# Patient Record
Sex: Female | Born: 1950 | Race: Black or African American | Hispanic: No | Smoking: Never smoker
Health system: Southern US, Community
[De-identification: ages and names within clinical notes are randomized; demographics above are authoritative.]

## PROBLEM LIST (undated history)

## (undated) DIAGNOSIS — F32A Depression, unspecified: Secondary | ICD-10-CM

## (undated) DIAGNOSIS — F329 Major depressive disorder, single episode, unspecified: Secondary | ICD-10-CM

## (undated) DIAGNOSIS — I10 Essential (primary) hypertension: Secondary | ICD-10-CM

## (undated) DIAGNOSIS — M1990 Unspecified osteoarthritis, unspecified site: Secondary | ICD-10-CM

## (undated) HISTORY — PX: ABDOMINAL HYSTERECTOMY: SHX81

---

## 2014-07-16 ENCOUNTER — Emergency Department (HOSPITAL_COMMUNITY)
Admission: EM | Admit: 2014-07-16 | Discharge: 2014-07-18 | Disposition: A | Discharge status: Home / Self Care | Payer: Federal, State, Local not specified - Other | Attending: Emergency Medicine | Admitting: Emergency Medicine

## 2014-07-16 ENCOUNTER — Other Ambulatory Visit: Payer: Self-pay

## 2014-07-16 DIAGNOSIS — F22 Delusional disorders: Secondary | ICD-10-CM | POA: Insufficient documentation

## 2014-07-16 DIAGNOSIS — F29 Unspecified psychosis not due to a substance or known physiological condition: Secondary | ICD-10-CM | POA: Insufficient documentation

## 2014-07-16 DIAGNOSIS — F1410 Cocaine abuse, uncomplicated: Secondary | ICD-10-CM | POA: Diagnosis present

## 2014-07-16 DIAGNOSIS — F1494 Cocaine use, unspecified with cocaine-induced mood disorder: Secondary | ICD-10-CM | POA: Diagnosis present

## 2014-07-16 DIAGNOSIS — R000 Tachycardia, unspecified: Secondary | ICD-10-CM | POA: Insufficient documentation

## 2014-07-16 DIAGNOSIS — F4325 Adjustment disorder with mixed disturbance of emotions and conduct: Secondary | ICD-10-CM | POA: Diagnosis present

## 2014-07-16 DIAGNOSIS — F911 Conduct disorder, childhood-onset type: Secondary | ICD-10-CM | POA: Insufficient documentation

## 2014-07-16 DIAGNOSIS — R52 Pain, unspecified: Secondary | ICD-10-CM

## 2014-07-16 DIAGNOSIS — F1310 Sedative, hypnotic or anxiolytic abuse, uncomplicated: Secondary | ICD-10-CM | POA: Insufficient documentation

## 2014-07-16 LAB — ACETAMINOPHEN LEVEL: Acetaminophen (Tylenol), Serum: <15 ug/mL (ref 10–30)

## 2014-07-16 LAB — COMPREHENSIVE METABOLIC PANEL
ALT: 17 U/L (ref 0–35)
ANION GAP: 17 — AB (ref 5–15)
AST: 33 U/L (ref 0–37)
Albumin: 4.2 g/dL (ref 3.5–5.2)
Alkaline Phosphatase: 68 U/L (ref 39–117)
BILIRUBIN TOTAL: 0.9 mg/dL (ref 0.3–1.2)
BUN: 10 mg/dL (ref 6–23)
CALCIUM: 9.9 mg/dL (ref 8.4–10.5)
CHLORIDE: 105 meq/L (ref 96–112)
CO2: 23 meq/L (ref 19–32)
Creatinine, Ser: 0.99 mg/dL (ref 0.50–1.10)
GFR calc Af Amer: 69 mL/min — ABNORMAL LOW (ref >90)
GFR calc non Af Amer: 60 mL/min — ABNORMAL LOW (ref >90)
Glucose, Bld: 110 mg/dL — ABNORMAL HIGH (ref 70–99)
Potassium: 4.3 mEq/L (ref 3.7–5.3)
Sodium: 145 mEq/L (ref 137–147)
Total Protein: 7.6 g/dL (ref 6.0–8.3)

## 2014-07-16 LAB — CBC WITH DIFFERENTIAL/PLATELET
BASOS ABS: 0.1 10*3/uL (ref 0.0–0.1)
BASOS PCT: 1 % (ref 0–1)
EOS ABS: 0.1 10*3/uL (ref 0.0–0.7)
Eosinophils Relative: 2 % (ref 0–5)
HCT: 34.2 % — ABNORMAL LOW (ref 36.0–46.0)
Hemoglobin: 11.1 g/dL — ABNORMAL LOW (ref 12.0–15.0)
Lymphocytes Relative: 43 % (ref 12–46)
Lymphs Abs: 2.8 10*3/uL (ref 0.7–4.0)
MCH: 28.5 pg (ref 26.0–34.0)
MCHC: 32.5 g/dL (ref 30.0–36.0)
MCV: 87.9 fL (ref 78.0–100.0)
Monocytes Absolute: 0.6 10*3/uL (ref 0.1–1.0)
Monocytes Relative: 10 % (ref 3–12)
NEUTROS PCT: 44 % (ref 43–77)
Neutro Abs: 2.9 10*3/uL (ref 1.7–7.7)
Platelets: 231 10*3/uL (ref 150–400)
RBC: 3.89 MIL/uL (ref 3.87–5.11)
RDW: 14.6 % (ref 11.5–15.5)
WBC: 6.5 10*3/uL (ref 4.0–10.5)

## 2014-07-16 LAB — SALICYLATE LEVEL

## 2014-07-16 LAB — RAPID URINE DRUG SCREEN, HOSP PERFORMED
Amphetamines: NOT DETECTED
Barbiturates: NOT DETECTED
Benzodiazepines: POSITIVE — AB
COCAINE: POSITIVE — AB
OPIATES: NOT DETECTED
Tetrahydrocannabinol: NOT DETECTED

## 2014-07-16 LAB — ETHANOL

## 2014-07-16 MED ORDER — LORAZEPAM 2 MG/ML IJ SOLN
1.0000 mg | Freq: Once | INTRAMUSCULAR | Status: AC
  Administered 2014-07-16: 1 mg via INTRAVENOUS
  Filled 2014-07-16: qty 1

## 2014-07-16 MED ORDER — CARBAMAZEPINE 200 MG PO TABS
200.0000 mg | ORAL_TABLET | Freq: Two times a day (BID) | ORAL | Status: DC
  Administered 2014-07-17 – 2014-07-18 (×4): 200 mg via ORAL
  Filled 2014-07-16 (×6): qty 1

## 2014-07-16 MED ORDER — TRAZODONE HCL 50 MG PO TABS: 50.0000 mg | ORAL_TABLET | Freq: Every evening | ORAL | Status: DC | PRN

## 2014-07-16 MED ORDER — HALOPERIDOL LACTATE 5 MG/ML IJ SOLN
5.0000 mg | Freq: Once | INTRAMUSCULAR | Status: AC
  Administered 2014-07-16: 5 mg via INTRAMUSCULAR
  Filled 2014-07-16: qty 1

## 2014-07-16 NOTE — ED Notes (Signed)
Report received from Brooks RN. Pt. Sleeping, respirations regular and unlabored. Will continue to monitor for safety. Q 15 minute checks continue. 

## 2014-07-16 NOTE — BH Assessment (Signed)
Assessment Note  Ashley Branch is an 63 y.o. female. [facility] initiated by daughter b/o bizarre behaviors, combative, and attempting to choking self.  Patient is currently resistant to questions during this assessment.  Patient is assumed to fall asleep between questions but is slightly aroused by touch.   CSW spoke with the patient's daughter Ashley Branch [phone] or [phone] to collect collateral information.  She reports that the patient's husband has cancer and was in the hospital for a couple so the patient was at the beside the entire time.  In the hospital the patient was exhibiting aggression towards the staff and being irrational.  Daughter reports this is not normal behavior as she is more rational and conservative.  When they returned home the patient called 911 reporting that she could see the husband's blood clot moving up his leg requesting help.  The husband was not experiencing any medical complications at that time per the daughter. When the police arrived the patient became aggressive and push one of the officer.  The patient was arrested but returned home later that day.  Daughter reports the patient attempted to choke self with hair until she began to gasp for air.  Patient was running out of the home naked per daughter's report.   CSW consulted with Dr. Jannifer FranklinAkintayo it is recommended for inpatient hospitalization for safety and stabilization.    Axis I: Adjustment D/O mixed, Cocaine use moderate Axis II: Deferred Axis III: No past medical history on file. Axis IV: other psychosocial or environmental problems, problems with access to health care services and problems with primary support group Axis V: 41-50 serious symptoms  Past Medical History: No past medical history on file.  No past surgical history on file.  Family History: No family history on file.  Social History:  has no tobacco, alcohol, and drug history on  file.  Additional Social History:     CIWA: CIWA-Ar BP: 128/69 mmHg Pulse Rate: 97 COWS:    Allergies: Allergies not on file  Home Medications:  (Not in a hospital admission)  OB/GYN Status:  No LMP recorded.  General Assessment Data Location of Assessment: [facility] ACT Assessment: Yes Is this a Tele or Face-to-Face Assessment?: Face-to-Face Is this an Initial Assessment or a Re-assessment for this encounter?: Initial Assessment Living Arrangements: Spouse/significant other Can pt return to current living arrangement?: Yes Admission Status: Involuntary Is patient capable of signing voluntary admission?: No Transfer from: Home Referral Source: Self/Family/Friend  Medical Screening Exam [facility] Walk-in ONLY) Medical Exam completed: Yes  [facility] Living Arrangements: Spouse/significant other Name of Psychiatrist: unknown Name of Therapist: unknown     Risk to self with the past 6 months Suicidal Ideation: Yes-Currently Present Suicidal Intent: Yes-Currently Present Is patient at risk for suicide?: Yes Suicidal Plan?: Yes-Currently Present (strangle self) Specify Current Suicidal Plan: pt attempted strangle self with hair Access to Means: Yes What has been your use of drugs/alcohol within the last 12 months?: crack Intentional Self Injurious Behavior: None Family Suicide History: Unknown Recent stressful life event(s): Loss (Comment) Persecutory voices/beliefs?: No Depression:  (unable to assess) Substance abuse history and/or treatment for substance abuse?: Yes  Risk to Others within the past 6 months Homicidal Ideation: No-Not Currently/Within Last 6 Months Thoughts of Harm to Others: No-Not Currently Present/Within Last 6 Months Current Homicidal Intent: No-Not Currently/Within Last 6 Months Current Homicidal Plan: No-Not Currently/Within Last 6 Months Access to Homicidal Means: No History  of harm to others?: No Assessment of Violence: None  Noted Violent Behavior Description: belligerent, combative Does patient have access to weapons?: No  Psychosis Delusions: Persecutory  Mental Status Report Appear/Hygiene: Bizarre, In hospital gown Eye Contact: Poor Motor Activity: Unable to assess Speech: Unable to assess Level of Consciousness: Sleeping, Drowsy Mood: Irritable Affect: Irritable, Unable to Assess Anxiety Level: None Thought Processes: Unable to Assess Judgement: Impaired Orientation: Unable to assess Obsessive Compulsive Thoughts/Behaviors: None  Cognitive Functioning Concentration: Unable to Assess Memory: Unable to Assess Insight: Unable to Assess Impulse Control: Unable to Assess Sleep: Unable to Assess Vegetative Symptoms: Unable to Assess  ADLScreening [facility] Assessment Services) Patient's cognitive ability adequate to safely complete daily activities?: Yes Patient able to express need for assistance with ADLs?: Yes Independently performs ADLs?: Yes (appropriate for developmental age)        ADL Screening (condition at time of admission) Patient's cognitive ability adequate to safely complete daily activities?: Yes Patient able to express need for assistance with ADLs?: Yes Independently performs ADLs?: Yes (appropriate for developmental age)                  Additional Information 1:1 In Past 12 Months?: No CIRT Risk: No Elopement Risk: No Does patient have medical clearance?: Yes     Disposition:  Disposition Initial Assessment Completed for this Encounter: Yes Disposition of Patient: Inpatient treatment program Type of inpatient treatment program: Adult  On Site Evaluation by:   Reviewed with Physician:    Maryelizabeth Rowanorbett, Kahlia Lagunes A 07/16/2014 12:45 PM

## 2014-07-16 NOTE — ED Notes (Addendum)
Pt's Belongings: 1 white shirt  1 pink sweat pants 1 pair black slippers 1 white and pink stripped sweatshirt. 1 pair white panties. 2 silver colored rings.

## 2014-07-16 NOTE — ED Notes (Signed)
Attempted x 2 to contact pt's daughter Toney ReilDaisy @ [phone] w/o success to obtain past medical hx.

## 2014-07-16 NOTE — ED Notes (Signed)
Pharmacy Tech here to see pt.

## 2014-07-16 NOTE — ED Provider Notes (Signed)
CSN: [id number]     Arrival date & time 07/16/14  0404 History   First MD Initiated Contact with Patient 07/16/14 0407     Chief Complaint  Patient presents with  . Psychiatric Evaluation    IVC     (Consider location/radiation/quality/duration/timing/severity/associated sxs/prior Treatment) Patient is a 63 y.o. female presenting with mental health disorder. The history is provided by the police. No language interpreter was used.  Mental Health Problem Presenting symptoms: agitation, bizarre behavior and delusional   Associated symptoms comment:  She arrives under IVC, taken out by family, for unusual behavior at home: screaming, stating she wanted to kill herself; 'choking' herself with her braided hair. Per IVC paperwork (family unavailable) she had to be physically restrained at home to keep her safe. After arrival here she was agitated, verbally and physically aggressive with [employer] and staff requiring soft restraints and chemical restraint. Mask placed on patient to prevent her from spitting on staff.   No past medical history on file. No past surgical history on file. No family history on file. History  Substance Use Topics  . Smoking status: Not on file  . Smokeless tobacco: Not on file  . Alcohol Use: Not on file   OB History    No data available     Review of Systems  Unable to perform ROS Psychiatric/Behavioral: Positive for agitation.      Allergies  Review of patient's allergies indicates not on file.  Home Medications   Prior to Admission medications   Not on File   BP 172/65 mmHg  Pulse 128  Temp(Src) 98.5 F (36.9 C) (Axillary)  Resp 24  SpO2 98% Physical Exam  Constitutional: She appears well-developed and well-nourished. No distress.  Neck: Normal range of motion. Neck supple.  Cardiovascular: Tachycardia present.   Pulmonary/Chest: No respiratory distress. She has no wheezes. She has no rales.  Abdominal: Soft.  Psychiatric: Her affect is  angry. She is agitated, aggressive and combative. Thought content is delusional. She expresses impulsivity and inappropriate judgment.    ED Course  Procedures (including critical care time) Labs Review Labs Reviewed  COMPREHENSIVE METABOLIC PANEL - Abnormal; Notable for the following:    Glucose, Bld 110 (*)    GFR calc non Af Amer 60 (*)    GFR calc Af Amer 69 (*)    Anion gap 17 (*)    All other components within normal limits  SALICYLATE LEVEL - Abnormal; Notable for the following:    Salicylate Lvl <2.0 (*)    All other components within normal limits  CBC WITH DIFFERENTIAL - Abnormal; Notable for the following:    Hemoglobin 11.1 (*)    HCT 34.2 (*)    All other components within normal limits  ETHANOL  ACETAMINOPHEN LEVEL  URINE RAPID DRUG SCREEN (HOSP PERFORMED)    Imaging Review No results found.   EKG Interpretation None      MDM   Final diagnoses:  None    1. Psychosis  Haldol and Ativan given. She is sleeping, VSS.  Past history unknown and patient's family is not available to provide further details. Will observe and wait for lab clearance. Placement anticipated pending reassessment.   Arnoldo HookerShari A Kami Kube, PA-C 07/16/14 [id number]  Purvis SheffieldForrest Harrison, MD 07/16/14 [phone]

## 2014-07-16 NOTE — Consult Note (Signed)
[facility] Consult   Reason for Consult: Bizarre behavior, self harming behavior Referring Physician:  [employer]  Ashley Branch is an 63 y.o. female. Total Time spent with patient: 30 minutes.  Assessment: AXIS I:  Adjustment disorder with mixed disturbance of emotions and conduct              Cocaine use disorder              Cocaine induced mood disorder AXIS II:  Deferred AXIS III:  No past medical history on file. AXIS IV:  other psychosocial or environmental problems and problems related to social environment AXIS V:  21-30 behavior considerably influenced by delusions or hallucinations OR serious impairment in judgment, communication OR inability to function in almost all areas  Plan:  Recommend psychiatric Inpatient admission when medically cleared.  Subjective:   Ashley Branch is a 63 y.o. female patient admitted with  Aggressive and bizarre behavior  HPI:  Patient  Who presents to the ED with severe agitation, bizarre behavior and delusional thinking.  She arrives under IVC, taken out by her family, for unusual behavior at home: screaming, stating she wanted to kill herself; 'choking' herself with her braided hair. Per IVC paperwork (family unavailable) she had to be physically restrained at home to keep her safe. After arrival to the hospital, patient became very agitated, verbally and physically aggressive with [employer] and was spitting on the staff requiring soft restraints. Patient remains uncooperative and has not volunteered much information regarding her current situation. Patient urine toxicology is positive for Cocaine and Benzodiazepine.  HPI Elements:   Location:  aggressive behavior. Quality:  severe. Duration:  few days. Context:  relapsed on cocaine use.  Past Psychiatric History: No past medical history on file.  has no tobacco, alcohol, and drug history on file. No family history on file. Family History Substance Abuse: Yes, Describe:  (crack) Family Supports: Yes, List: (husband, daughter) Living Arrangements: Spouse/significant other Can pt return to current living arrangement?: Yes   Allergies:  Allergies not on file  ACT Assessment Complete:  Yes:    Educational Status    Risk to Self: Risk to self with the past 6 months Suicidal Ideation: Yes-Currently Present Suicidal Intent: Yes-Currently Present Is patient at risk for suicide?: Yes Suicidal Plan?: Yes-Currently Present (strangle self) Specify Current Suicidal Plan: pt attempted strangle self with hair Access to Means: Yes What has been your use of drugs/alcohol within the last 12 months?: crack Intentional Self Injurious Behavior: None Family Suicide History: Unknown Recent stressful life event(s): Loss (Comment) Persecutory voices/beliefs?: No Depression:  (unable to assess) Substance abuse history and/or treatment for substance abuse?: Yes  Risk to Others: Risk to Others within the past 6 months Homicidal Ideation: No-Not Currently/Within Last 6 Months Thoughts of Harm to Others: No-Not Currently Present/Within Last 6 Months Current Homicidal Intent: No-Not Currently/Within Last 6 Months Current Homicidal Plan: No-Not Currently/Within Last 6 Months Access to Homicidal Means: No History of harm to others?: No Assessment of Violence: None Noted Violent Behavior Description: belligerent, combative Does patient have access to weapons?: No  Abuse:    Prior Inpatient Therapy:    Prior Outpatient Therapy:    Additional Information: Additional Information 1:1 In Past 12 Months?: No CIRT Risk: No Elopement Risk: No Does patient have medical clearance?: Yes          Objective: Blood pressure 128/69, pulse 97, temperature 98.7 F (37.1 C), temperature source Oral, resp. rate 18, SpO2 96 %.There is no  height or weight on file to calculate BMI. Results for orders placed or performed during the hospital encounter of 07/16/14 (from the past 72  hour(s))  Comprehensive metabolic panel     Status: Abnormal   Collection Time: 07/16/14  4:42 AM  Result Value Ref Range   Sodium 145 137 - 147 mEq/L   Potassium 4.3 3.7 - 5.3 mEq/L   Chloride 105 96 - 112 mEq/L   CO2 23 19 - 32 mEq/L   Glucose, Bld 110 (H) 70 - 99 mg/dL   BUN 10 6 - 23 mg/dL   Creatinine, Ser 0.99 0.50 - 1.10 mg/dL   Calcium 9.9 8.4 - 10.5 mg/dL   Total Protein 7.6 6.0 - 8.3 g/dL   Albumin 4.2 3.5 - 5.2 g/dL   AST 33 0 - 37 U/L   ALT 17 0 - 35 U/L   Alkaline Phosphatase 68 39 - 117 U/L   Total Bilirubin 0.9 0.3 - 1.2 mg/dL   GFR calc non Af Amer 60 (L) >90 mL/min   GFR calc Af Amer 69 (L) >90 mL/min    Comment: (NOTE) The eGFR has been calculated using the CKD EPI equation. This calculation has not been validated in all clinical situations. eGFR's persistently <90 mL/min signify possible Chronic Kidney Disease.    Anion gap 17 (H) 5 - 15  Ethanol     Status: None   Collection Time: 07/16/14  4:42 AM  Result Value Ref Range   Alcohol, Ethyl (B) <11 0 - 11 mg/dL    Comment:        LOWEST DETECTABLE LIMIT FOR SERUM ALCOHOL IS 11 mg/dL FOR MEDICAL PURPOSES ONLY   Salicylate level     Status: Abnormal   Collection Time: 07/16/14  4:42 AM  Result Value Ref Range   Salicylate Lvl <0.3 (L) 2.8 - 20.0 mg/dL  CBC with Differential     Status: Abnormal   Collection Time: 07/16/14  4:42 AM  Result Value Ref Range   WBC 6.5 4.0 - 10.5 K/uL   RBC 3.89 3.87 - 5.11 MIL/uL   Hemoglobin 11.1 (L) 12.0 - 15.0 g/dL   HCT 34.2 (L) 36.0 - 46.0 %   MCV 87.9 78.0 - 100.0 fL   MCH 28.5 26.0 - 34.0 pg   MCHC 32.5 30.0 - 36.0 g/dL   RDW 14.6 11.5 - 15.5 %   Platelets 231 150 - 400 K/uL   Neutrophils Relative % 44 43 - 77 %   Neutro Abs 2.9 1.7 - 7.7 K/uL   Lymphocytes Relative 43 12 - 46 %   Lymphs Abs 2.8 0.7 - 4.0 K/uL   Monocytes Relative 10 3 - 12 %   Monocytes Absolute 0.6 0.1 - 1.0 K/uL   Eosinophils Relative 2 0 - 5 %   Eosinophils Absolute 0.1 0.0 - 0.7 K/uL    Basophils Relative 1 0 - 1 %   Basophils Absolute 0.1 0.0 - 0.1 K/uL  Acetaminophen level     Status: None   Collection Time: 07/16/14  4:42 AM  Result Value Ref Range   Acetaminophen (Tylenol), Serum <15.0 10 - 30 ug/mL    Comment:        THERAPEUTIC CONCENTRATIONS VARY SIGNIFICANTLY. A RANGE OF 10-30 ug/mL MAY BE AN EFFECTIVE CONCENTRATION FOR MANY PATIENTS. HOWEVER, SOME ARE BEST TREATED AT CONCENTRATIONS OUTSIDE THIS RANGE. ACETAMINOPHEN CONCENTRATIONS >150 ug/mL AT 4 HOURS AFTER INGESTION AND >50 ug/mL AT 12 HOURS AFTER INGESTION ARE OFTEN ASSOCIATED WITH  TOXIC REACTIONS.   Urine rapid drug screen (hosp performed)     Status: Abnormal   Collection Time: 07/16/14  5:57 AM  Result Value Ref Range   Opiates NONE DETECTED NONE DETECTED   Cocaine POSITIVE (A) NONE DETECTED   Benzodiazepines POSITIVE (A) NONE DETECTED   Amphetamines NONE DETECTED NONE DETECTED   Tetrahydrocannabinol NONE DETECTED NONE DETECTED   Barbiturates NONE DETECTED NONE DETECTED    Comment:        DRUG SCREEN FOR MEDICAL PURPOSES ONLY.  IF CONFIRMATION IS NEEDED FOR ANY PURPOSE, NOTIFY LAB WITHIN 5 DAYS.        LOWEST DETECTABLE LIMITS FOR URINE DRUG SCREEN Drug Class       Cutoff (ng/mL) Amphetamine      1000 Barbiturate      200 Benzodiazepine   993 Tricyclics       570 Opiates          300 Cocaine          300 THC              50    Labs are reviewed and are pertinent for the above.  Current Facility-Administered Medications  Medication Dose Route Frequency Provider Last Rate Last Dose  . carbamazepine (TEGRETOL) tablet 200 mg  200 mg Oral BID PC Kemar Pandit      . traZODone (DESYREL) tablet 50 mg  50 mg Oral QHS PRN Alyzza Andringa       No current outpatient prescriptions on file.    Psychiatric Specialty Exam:     Blood pressure 128/69, pulse 97, temperature 98.7 F (37.1 C), temperature source Oral, resp. rate 18, SpO2 96 %.There is no height or weight on file to  calculate BMI.  General Appearance: Casual  Eye Contact::  Minimal  Speech:  Slow and minimal  Volume:  Decreased  Mood:  Anxious and Depressed  Affect:  Depressed  Thought Process:  Disorganized  Orientation:  Full (Time, Place, and Person)  Thought Content:  Delusions  Suicidal Thoughts:  No  Homicidal Thoughts:  No  Memory:  Immediate;   Fair Recent;   Fair Remote;   Fair  Judgement:  Impaired  Insight:  Shallow  Psychomotor Activity:  Increased  Concentration:  Fair  Recall:  [employer] of Knowledge:Fair  Language: Good  Akathisia:  No  Handed:  Right  AIMS (if indicated):     Assets:  Communication Skills Physical Health  Sleep:   poor   Musculoskeletal: Strength & Muscle Tone: within normal limits Gait & Station: normal Patient leans: N/A  Treatment Plan Summary: Daily contact with patient to assess and evaluate symptoms and progress in treatment Medication management Recommends inpatient admission for stabilization  Corena Pilgrim, MD 07/16/2014 1:00 PM

## 2014-07-16 NOTE — ED Notes (Addendum)
Pt brought in by [employer], refusing to get out of patrol car, as family has IVC'd her d/t bizarre behavior including choking herself with her hair.  Pt is combative upon arrival, striking out at staff and spitting at staff.  Pt placed in restraints w/ Dr. Romeo AppleHarrison present in room to ensure pt and staff safety.  Pt refusing to answer any past medical or surgical hx.  Most of triage questions this writer was unable to fill in as pt would not cooperate.  Pt repeatedly yelling obscenities.

## 2014-07-17 MED ORDER — RISPERIDONE 1 MG PO TABS
1.0000 mg | ORAL_TABLET | Freq: Two times a day (BID) | ORAL | Status: DC
  Administered 2014-07-17 – 2014-07-18 (×2): 1 mg via ORAL
  Filled 2014-07-17 (×2): qty 1

## 2014-07-17 MED ORDER — LISINOPRIL 5 MG PO TABS
5.0000 mg | ORAL_TABLET | Freq: Every day | ORAL | Status: DC
  Administered 2014-07-17 – 2014-07-18 (×2): 5 mg via ORAL
  Filled 2014-07-17 (×2): qty 1

## 2014-07-17 MED ORDER — DIPHENHYDRAMINE HCL 25 MG PO CAPS
25.0000 mg | ORAL_CAPSULE | Freq: Four times a day (QID) | ORAL | Status: DC | PRN
  Administered 2014-07-17: 25 mg via ORAL
  Filled 2014-07-17: qty 1

## 2014-07-17 MED ORDER — FLUOXETINE HCL 20 MG PO CAPS
80.0000 mg | ORAL_CAPSULE | Freq: Every day | ORAL | Status: DC
  Administered 2014-07-18: 80 mg via ORAL
  Filled 2014-07-17 (×2): qty 4

## 2014-07-17 MED ORDER — FAMOTIDINE 20 MG PO TABS
20.0000 mg | ORAL_TABLET | Freq: Every day | ORAL | Status: DC
  Administered 2014-07-17 – 2014-07-18 (×2): 20 mg via ORAL
  Filled 2014-07-17 (×2): qty 1

## 2014-07-17 NOTE — BH Assessment (Signed)
[facility] Assessment Progress Note    The following gero psych facilities were contacted in an attempt to place the pt:  [facility] per Candace) [facility] (beds available per Tammie)  [facility]  [facility] ([facility] per [place]) [facility]  [facility] answer: [facility]   TTS will continue to seek placement for the pt.  Beryle FlockMary Hiba Garry, MS, CRC, [facility] Licensed Professional Counselor Therapeutic Triage Specialist [facility] Phone: [phone] Fax: [phone]

## 2014-07-17 NOTE — ED Notes (Signed)
Please call daughter, Toney ReilDaisy, when patient is ready to be transferred: [phone]

## 2014-07-17 NOTE — ED Notes (Signed)
Patient has been up and about on the unit most of the day.  Is frequently seen cleaning or wiping down surfaces.  Thinking is very disorganized, but has been cooperative.  Visited with husband for a while.  Taking medications and tolerating well.

## 2014-07-17 NOTE — BH Assessment (Signed)
[facility] saying [facility].  Harlin RainFord Ellis Ria CommentWarrick Jr, LPC, [facility] Triage Specialist [phone]

## 2014-07-18 ENCOUNTER — Inpatient Hospital Stay (HOSPITAL_COMMUNITY)
Admission: AD | Admit: 2014-07-18 | Discharge: 2014-07-25 | DRG: 885 | Disposition: A | Discharge status: Home / Self Care | Payer: Federal, State, Local not specified - Other | Source: Intra-hospital | Attending: Psychiatry | Admitting: Psychiatry

## 2014-07-18 ENCOUNTER — Emergency Department (HOSPITAL_COMMUNITY): Payer: Self-pay

## 2014-07-18 ENCOUNTER — Encounter (HOSPITAL_COMMUNITY): Payer: Self-pay

## 2014-07-18 DIAGNOSIS — G4700 Insomnia, unspecified: Secondary | ICD-10-CM | POA: Diagnosis present

## 2014-07-18 DIAGNOSIS — K219 Gastro-esophageal reflux disease without esophagitis: Secondary | ICD-10-CM | POA: Diagnosis present

## 2014-07-18 DIAGNOSIS — F1420 Cocaine dependence, uncomplicated: Secondary | ICD-10-CM | POA: Diagnosis present

## 2014-07-18 DIAGNOSIS — Z23 Encounter for immunization: Secondary | ICD-10-CM | POA: Diagnosis not present

## 2014-07-18 DIAGNOSIS — F411 Generalized anxiety disorder: Secondary | ICD-10-CM | POA: Diagnosis present

## 2014-07-18 DIAGNOSIS — F332 Major depressive disorder, recurrent severe without psychotic features: Secondary | ICD-10-CM | POA: Diagnosis present

## 2014-07-18 DIAGNOSIS — B373 Candidiasis of vulva and vagina: Secondary | ICD-10-CM | POA: Diagnosis present

## 2014-07-18 DIAGNOSIS — F1320 Sedative, hypnotic or anxiolytic dependence, uncomplicated: Secondary | ICD-10-CM | POA: Insufficient documentation

## 2014-07-18 DIAGNOSIS — M1990 Unspecified osteoarthritis, unspecified site: Secondary | ICD-10-CM | POA: Diagnosis present

## 2014-07-18 DIAGNOSIS — F4325 Adjustment disorder with mixed disturbance of emotions and conduct: Secondary | ICD-10-CM

## 2014-07-18 DIAGNOSIS — I10 Essential (primary) hypertension: Secondary | ICD-10-CM | POA: Diagnosis present

## 2014-07-18 DIAGNOSIS — F1494 Cocaine use, unspecified with cocaine-induced mood disorder: Secondary | ICD-10-CM

## 2014-07-18 HISTORY — DX: Major depressive disorder, single episode, unspecified: F32.9

## 2014-07-18 HISTORY — DX: Depression, unspecified: F32.A

## 2014-07-18 HISTORY — DX: Essential (primary) hypertension: I10

## 2014-07-18 HISTORY — DX: Unspecified osteoarthritis, unspecified site: M19.90

## 2014-07-18 LAB — URINALYSIS, ROUTINE W REFLEX MICROSCOPIC
Bilirubin Urine: NEGATIVE
Glucose, UA: NEGATIVE mg/dL
HGB URINE DIPSTICK: NEGATIVE
Ketones, ur: NEGATIVE mg/dL
Nitrite: NEGATIVE
PH: 6.5 (ref 5.0–8.0)
Protein, ur: NEGATIVE mg/dL
SPECIFIC GRAVITY, URINE: 1.003 — AB (ref 1.005–1.030)
UROBILINOGEN UA: 0.2 mg/dL (ref 0.0–1.0)

## 2014-07-18 LAB — URINE MICROSCOPIC-ADD ON

## 2014-07-18 MED ORDER — ALUM & MAG HYDROXIDE-SIMETH 200-200-20 MG/5ML PO SUSP
30.0000 mL | ORAL | Status: DC | PRN
  Administered 2014-07-20: 30 mL via ORAL
  Filled 2014-07-18: qty 30

## 2014-07-18 MED ORDER — BENZTROPINE MESYLATE 1 MG PO TABS
1.0000 mg | ORAL_TABLET | Freq: Two times a day (BID) | ORAL | Status: DC
  Administered 2014-07-18: 1 mg via ORAL
  Filled 2014-07-18: qty 1

## 2014-07-18 MED ORDER — SULFAMETHOXAZOLE-TRIMETHOPRIM 400-80 MG PO TABS
1.0000 | ORAL_TABLET | Freq: Two times a day (BID) | ORAL | Status: DC
  Administered 2014-07-19 – 2014-07-25 (×13): 1 via ORAL
  Filled 2014-07-18 (×15): qty 1

## 2014-07-18 MED ORDER — RISPERIDONE 2 MG PO TBDP: 2.0000 mg | ORAL_TABLET | Freq: Three times a day (TID) | ORAL | Status: DC | PRN

## 2014-07-18 MED ORDER — PROMETHAZINE HCL 25 MG PO TABS: 50.0000 mg | ORAL_TABLET | Freq: Four times a day (QID) | ORAL | Status: DC | PRN

## 2014-07-18 MED ORDER — LISINOPRIL 5 MG PO TABS
5.0000 mg | ORAL_TABLET | Freq: Every day | ORAL | Status: DC
  Administered 2014-07-20 – 2014-07-25 (×5): 5 mg via ORAL
  Filled 2014-07-18 (×8): qty 1

## 2014-07-18 MED ORDER — LORATADINE 10 MG PO TABS
10.0000 mg | ORAL_TABLET | Freq: Every day | ORAL | Status: DC
  Administered 2014-07-19 – 2014-07-25 (×7): 10 mg via ORAL
  Filled 2014-07-18 (×9): qty 1

## 2014-07-18 MED ORDER — INFLUENZA VAC SPLIT QUAD 0.5 ML IM SUSY
0.5000 mL | PREFILLED_SYRINGE | INTRAMUSCULAR | Status: AC
  Administered 2014-07-20: 0.5 mL via INTRAMUSCULAR
  Filled 2014-07-18: qty 0.5

## 2014-07-18 MED ORDER — LORAZEPAM 1 MG PO TABS
1.0000 mg | ORAL_TABLET | ORAL | Status: AC | PRN
  Administered 2014-07-19: 1 mg via ORAL
  Filled 2014-07-18: qty 1

## 2014-07-18 MED ORDER — ACETAMINOPHEN 325 MG PO TABS
650.0000 mg | ORAL_TABLET | Freq: Four times a day (QID) | ORAL | Status: DC | PRN
  Administered 2014-07-19: 650 mg via ORAL
  Filled 2014-07-18: qty 2

## 2014-07-18 MED ORDER — FAMOTIDINE 20 MG PO TABS
20.0000 mg | ORAL_TABLET | Freq: Two times a day (BID) | ORAL | Status: DC
  Administered 2014-07-18 – 2014-07-25 (×14): 20 mg via ORAL
  Filled 2014-07-18 (×17): qty 1

## 2014-07-18 MED ORDER — ZIPRASIDONE MESYLATE 20 MG IM SOLR: 20.0000 mg | INTRAMUSCULAR | Status: DC | PRN

## 2014-07-18 MED ORDER — FLUOXETINE HCL 20 MG PO CAPS
80.0000 mg | ORAL_CAPSULE | Freq: Every day | ORAL | Status: DC
  Administered 2014-07-19 – 2014-07-25 (×7): 80 mg via ORAL
  Filled 2014-07-18 (×9): qty 4

## 2014-07-18 MED ORDER — MAGNESIUM HYDROXIDE 400 MG/5ML PO SUSP: 30.0000 mL | Freq: Every day | ORAL | Status: DC | PRN

## 2014-07-18 MED ORDER — TRAMADOL HCL 50 MG PO TABS: 50.0000 mg | ORAL_TABLET | Freq: Four times a day (QID) | ORAL | Status: DC | PRN

## 2014-07-18 MED ORDER — TRAZODONE HCL 50 MG PO TABS
50.0000 mg | ORAL_TABLET | Freq: Two times a day (BID) | ORAL | Status: DC
  Administered 2014-07-18 – 2014-07-19 (×2): 50 mg via ORAL
  Filled 2014-07-18 (×6): qty 1

## 2014-07-18 MED ORDER — HYDROXYZINE HCL 25 MG PO TABS
25.0000 mg | ORAL_TABLET | Freq: Four times a day (QID) | ORAL | Status: DC | PRN
  Administered 2014-07-19 – 2014-07-24 (×6): 25 mg via ORAL
  Filled 2014-07-18 (×4): qty 1
  Filled 2014-07-18: qty 20
  Filled 2014-07-18 (×2): qty 1

## 2014-07-18 NOTE — BH Assessment (Signed)
[facility] Dr[facility] Jannifer FranklinAkintayo and Assunta FoundShuvon Rankin, NP. Bed assignment is 502-2. Nursing report [phone] or [phone]. Patient under IVC and sheriff will transport.

## 2014-07-18 NOTE — BH Assessment (Signed)
[facility] by Dr. Jannifer FranklinAkintayo and Assunta FoundShuvon Rankin, NP. Bed assignment 502-2. Support paperwork completed. Nursing report [phone].

## 2014-07-18 NOTE — ED Notes (Signed)
Pt transported to [facility] by [employer] for continuation of specialized care. She left in no acute distress. 

## 2014-07-18 NOTE — ED Notes (Signed)
Informed EDP of UA.  No new orders given, Encouraged fluids and instructed patient to inform staff of UTI symptoms.

## 2014-07-18 NOTE — Tx Team (Signed)
Initial Interdisciplinary Treatment Plan   PATIENT STRESSORS: Health problems Medication change or noncompliance Substance abuse Traumatic event   PATIENT STRENGTHS: Active sense of humor General fund of knowledge Religious Affiliation Supportive family/friends   PROBLEM LIST: Problem List/Patient Goals Date to be addressed Date deferred Reason deferred Estimated date of resolution  Depression 07/18/14     Risk for suicide 07/18/14     Caregiver strain 07/18/14                                          DISCHARGE CRITERIA:  Ability to meet basic life and health needs Improved stabilization in mood, thinking, and/or behavior Verbal commitment to aftercare and medication compliance  PRELIMINARY DISCHARGE PLAN: Attend aftercare/continuing care group Outpatient therapy  PATIENT/FAMIILY INVOLVEMENT: This treatment plan has been presented to and reviewed with the patient, Ashley Branch.  The patient and family have been given the opportunity to ask questions and make suggestions.  Jacques Navyhillips, Tae Robak A 07/18/2014, 11:52 PM

## 2014-07-18 NOTE — Consult Note (Signed)
Ashley Branch   Reason for Branch: Bizarre behavior, self harming behavior Referring Physician:  EPD  Ashley Branch is an 63 y.o. female. Total Time spent with patient: 30 minutes.  Assessment: AXIS I:  Adjustment disorder with mixed disturbance of emotions and conduct              Cocaine use disorder              Cocaine induced mood disorder AXIS II:  Deferred AXIS III:  No past medical history on file. AXIS IV:  other psychosocial or environmental problems and problems related to social environment AXIS V:  21-30 behavior considerably influenced by delusions or hallucinations OR serious impairment in judgment, communication OR inability to function in almost all areas  Plan:  Recommend psychiatric Inpatient admission when medically cleared.  Subjective:   Ashley Branch is a 63 y.o. female patient admitted with  Aggressive and bizarre behavior. Pt seen and evaluated by Earleen Newport, NP. This NP helping with dictation and ED overflow. Per above provider, pt continues to be severely delusional and disorganized, meeting inpatient psychiatric hospitalization criteria.   HPI:  Patient  Who presents to the ED with severe agitation, bizarre behavior and delusional thinking.  She arrives under IVC, taken out by her family, for unusual behavior at home: screaming, stating she wanted to kill herself; 'choking' herself with her braided hair. Per IVC paperwork (family unavailable) she had to be physically restrained at home to keep her safe. After arrival to the hospital, patient became very agitated, verbally and physically aggressive with [employer] and was spitting on the staff requiring soft restraints. Patient remains uncooperative and has not volunteered much information regarding her current situation. Patient urine toxicology is positive for Cocaine and Benzodiazepine.  HPI Elements:   Location:  aggressive behavior. Quality:  severe. Duration:  few days. Context:   relapsed on cocaine use.  Past Psychiatric History: No past medical history on file.  has no tobacco, alcohol, and drug history on file. No family history on file. Family History Substance Abuse: Yes, Describe: (crack) Family Supports: Yes, List: (husband, daughter) Living Arrangements: Spouse/significant other Can pt return to current living arrangement?: Yes   Allergies:   Allergies  Allergen Reactions  . Penicillins Swelling    ACT Assessment Complete:  Yes:    Educational Status    Risk to Self: Risk to self with the past 6 months Suicidal Ideation: Yes-Currently Present Suicidal Intent: Yes-Currently Present Is patient at risk for suicide?: Yes Suicidal Plan?: Yes-Currently Present (strangle self) Specify Current Suicidal Plan: pt attempted strangle self with hair Access to Means: Yes What has been your use of drugs/alcohol within the last 12 months?: crack Intentional Self Injurious Behavior: None Family Suicide History: Unknown Recent stressful life event(s): Loss (Comment) Persecutory voices/beliefs?: No Depression:  (unable to assess) Substance abuse history and/or treatment for substance abuse?: No  Risk to Others: Risk to Others within the past 6 months Homicidal Ideation: No-Not Currently/Within Last 6 Months Thoughts of Harm to Others: No-Not Currently Present/Within Last 6 Months Current Homicidal Intent: No-Not Currently/Within Last 6 Months Current Homicidal Plan: No-Not Currently/Within Last 6 Months Access to Homicidal Means: No History of harm to others?: No Assessment of Violence: None Noted Violent Behavior Description: belligerent, combative Does patient have access to weapons?: No  Abuse:    Prior Inpatient Therapy:    Prior Outpatient Therapy:    Additional Information: Additional Information 1:1 In Past 12 Months?: No CIRT Risk: No  Elopement Risk: No Does patient have medical clearance?: Yes          Objective: Blood pressure  110/54, pulse 88, temperature 98.5 F (36.9 C), temperature source Oral, resp. rate 18, SpO2 100 %.There is no height or weight on file to calculate BMI. Results for orders placed or performed during the hospital encounter of 07/16/14 (from the past 72 hour(s))  Comprehensive metabolic panel     Status: Abnormal   Collection Time: 07/16/14  4:42 AM  Result Value Ref Range   Sodium 145 137 - 147 mEq/L   Potassium 4.3 3.7 - 5.3 mEq/L   Chloride 105 96 - 112 mEq/L   CO2 23 19 - 32 mEq/L   Glucose, Bld 110 (H) 70 - 99 mg/dL   BUN 10 6 - 23 mg/dL   Creatinine, Ser 0.99 0.50 - 1.10 mg/dL   Calcium 9.9 8.4 - 10.5 mg/dL   Total Protein 7.6 6.0 - 8.3 g/dL   Albumin 4.2 3.5 - 5.2 g/dL   AST 33 0 - 37 U/L   ALT 17 0 - 35 U/L   Alkaline Phosphatase 68 39 - 117 U/L   Total Bilirubin 0.9 0.3 - 1.2 mg/dL   GFR calc non Af Amer 60 (L) >90 mL/min   GFR calc Af Amer 69 (L) >90 mL/min    Comment: (NOTE) The eGFR has been calculated using the CKD EPI equation. This calculation has not been validated in all clinical situations. eGFR's persistently <90 mL/min signify possible Chronic Kidney Disease.    Anion gap 17 (H) 5 - 15  Ethanol     Status: None   Collection Time: 07/16/14  4:42 AM  Result Value Ref Range   Alcohol, Ethyl (B) <11 0 - 11 mg/dL    Comment:        LOWEST DETECTABLE LIMIT FOR SERUM ALCOHOL IS 11 mg/dL FOR MEDICAL PURPOSES ONLY   Salicylate level     Status: Abnormal   Collection Time: 07/16/14  4:42 AM  Result Value Ref Range   Salicylate Lvl <1.0 (L) 2.8 - 20.0 mg/dL  CBC with Differential     Status: Abnormal   Collection Time: 07/16/14  4:42 AM  Result Value Ref Range   WBC 6.5 4.0 - 10.5 K/uL   RBC 3.89 3.87 - 5.11 MIL/uL   Hemoglobin 11.1 (L) 12.0 - 15.0 g/dL   HCT 34.2 (L) 36.0 - 46.0 %   MCV 87.9 78.0 - 100.0 fL   MCH 28.5 26.0 - 34.0 pg   MCHC 32.5 30.0 - 36.0 g/dL   RDW 14.6 11.5 - 15.5 %   Platelets 231 150 - 400 K/uL   Neutrophils Relative % 44 43 - 77 %    Neutro Abs 2.9 1.7 - 7.7 K/uL   Lymphocytes Relative 43 12 - 46 %   Lymphs Abs 2.8 0.7 - 4.0 K/uL   Monocytes Relative 10 3 - 12 %   Monocytes Absolute 0.6 0.1 - 1.0 K/uL   Eosinophils Relative 2 0 - 5 %   Eosinophils Absolute 0.1 0.0 - 0.7 K/uL   Basophils Relative 1 0 - 1 %   Basophils Absolute 0.1 0.0 - 0.1 K/uL  Acetaminophen level     Status: None   Collection Time: 07/16/14  4:42 AM  Result Value Ref Range   Acetaminophen (Tylenol), Serum <15.0 10 - 30 ug/mL    Comment:        THERAPEUTIC CONCENTRATIONS VARY SIGNIFICANTLY. A RANGE OF 10-30  ug/mL MAY BE AN EFFECTIVE CONCENTRATION FOR MANY PATIENTS. HOWEVER, SOME ARE BEST TREATED AT CONCENTRATIONS OUTSIDE THIS RANGE. ACETAMINOPHEN CONCENTRATIONS >150 ug/mL AT 4 HOURS AFTER INGESTION AND >50 ug/mL AT 12 HOURS AFTER INGESTION ARE OFTEN ASSOCIATED WITH TOXIC REACTIONS.   Urine rapid drug screen (hosp performed)     Status: Abnormal   Collection Time: 07/16/14  5:57 AM  Result Value Ref Range   Opiates NONE DETECTED NONE DETECTED   Cocaine POSITIVE (A) NONE DETECTED   Benzodiazepines POSITIVE (A) NONE DETECTED   Amphetamines NONE DETECTED NONE DETECTED   Tetrahydrocannabinol NONE DETECTED NONE DETECTED   Barbiturates NONE DETECTED NONE DETECTED    Comment:        DRUG SCREEN FOR MEDICAL PURPOSES ONLY.  IF CONFIRMATION IS NEEDED FOR ANY PURPOSE, NOTIFY LAB WITHIN 5 DAYS.        LOWEST DETECTABLE LIMITS FOR URINE DRUG SCREEN Drug Class       Cutoff (ng/mL) Amphetamine      1000 Barbiturate      200 Benzodiazepine   166 Tricyclics       063 Opiates          300 Cocaine          300 THC              50   Urinalysis, Routine w reflex microscopic     Status: Abnormal   Collection Time: 07/18/14 10:40 AM  Result Value Ref Range   Color, Urine YELLOW YELLOW   APPearance TURBID (A) CLEAR   Specific Gravity, Urine 1.003 (L) 1.005 - 1.030   pH 6.5 5.0 - 8.0   Glucose, UA NEGATIVE NEGATIVE mg/dL   Hgb urine  dipstick NEGATIVE NEGATIVE   Bilirubin Urine NEGATIVE NEGATIVE   Ketones, ur NEGATIVE NEGATIVE mg/dL   Protein, ur NEGATIVE NEGATIVE mg/dL   Urobilinogen, UA 0.2 0.0 - 1.0 mg/dL   Nitrite NEGATIVE NEGATIVE   Leukocytes, UA MODERATE (A) NEGATIVE  Urine microscopic-add on     Status: None   Collection Time: 07/18/14 10:40 AM  Result Value Ref Range   Squamous Epithelial / LPF RARE RARE   WBC, UA 3-6 <3 WBC/hpf   Labs are reviewed and are pertinent for the above.  Current Facility-Administered Medications  Medication Dose Route Frequency Provider Last Rate Last Dose  . benztropine (COGENTIN) tablet 1 mg  1 mg Oral BID Ares Tegtmeyer   1 mg at 07/18/14 1231  . carbamazepine (TEGRETOL) tablet 200 mg  200 mg Oral BID PC Livian Vanderbeck   200 mg at 07/18/14 1030  . diphenhydrAMINE (BENADRYL) capsule 25 mg  25 mg Oral Q6H PRN Karee Forge   25 mg at 07/17/14 1145  . famotidine (PEPCID) tablet 20 mg  20 mg Oral Daily Waylan Boga, NP   20 mg at 07/18/14 1030  . FLUoxetine (PROZAC) capsule 80 mg  80 mg Oral Daily Waylan Boga, NP   80 mg at 07/18/14 1030  . lisinopril (PRINIVIL,ZESTRIL) tablet 5 mg  5 mg Oral Daily Waylan Boga, NP   5 mg at 07/18/14 1030  . risperiDONE (RISPERDAL) tablet 1 mg  1 mg Oral BID Waylan Boga, NP   1 mg at 07/18/14 1033  . traZODone (DESYREL) tablet 50 mg  50 mg Oral QHS PRN Kenyah Luba       Current Outpatient Prescriptions  Medication Sig Dispense Refill  . Acetaminophen (ARTHRITIS PAIN PO) Take 1 tablet by mouth daily.    Marland Kitchen BIOTIN PO  Take 10,000 mcg by mouth daily.     . fexofenadine (ALLEGRA) 180 MG tablet Take 180 mg by mouth daily.    Marland Kitchen FLUoxetine (PROZAC) 40 MG capsule Take 80 mg by mouth daily.    Marland Kitchen lisinopril (PRINIVIL,ZESTRIL) 5 MG tablet Take 5 mg by mouth daily.    . promethazine (PHENERGAN) 50 MG tablet Take 50 mg by mouth every 6 (six) hours as needed for nausea or vomiting (nausea).    . ranitidine (ZANTAC) 150 MG tablet Take 150 mg by mouth  2 (two) times daily.    Marland Kitchen sulfamethoxazole-trimethoprim (BACTRIM,SEPTRA) 400-80 MG per tablet Take 1 tablet by mouth 2 (two) times daily.    . traMADol (ULTRAM) 50 MG tablet Take 50 mg by mouth every 6 (six) hours as needed for moderate pain (pain).    . traZODone (DESYREL) 50 MG tablet Take 50 mg by mouth 2 (two) times daily.      Psychiatric Specialty Exam:     Blood pressure 110/54, pulse 88, temperature 98.5 F (36.9 C), temperature source Oral, resp. rate 18, SpO2 100 %.There is no height or weight on file to calculate BMI.  General Appearance: Casual  Eye Contact::  Minimal  Speech:  Slow and minimal  Volume:  Decreased  Mood:  Anxious and Depressed  Affect:  Depressed  Thought Process:  Disorganized  Orientation:  Full (Time, Place, and Person)  Thought Content:  Delusions  Suicidal Thoughts:  No  Homicidal Thoughts:  No  Memory:  Immediate;   Fair Recent;   Fair Remote;   Fair  Judgement:  Impaired  Insight:  Shallow  Psychomotor Activity:  Increased  Concentration:  Fair  Recall:  [employer] of Knowledge:Fair  Language: Good  Akathisia:  No  Handed:  Right  AIMS (if indicated):     Assets:  Communication Skills Physical Health  Sleep:   poor   Musculoskeletal: Strength & Muscle Tone: within normal limits Gait & Station: normal Patient leans: N/A  Treatment Plan Summary: Daily contact with patient to assess and evaluate symptoms and progress in treatment Medication management Recommends inpatient admission for stabilization  Guadelupe Sabin C,FNP-BC 07/18/2014 1:22 PM  Patient seen, evaluated and I agree with notes by Nurse Practitioner. Corena Pilgrim, MD
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
## 2014-07-19 ENCOUNTER — Encounter (HOSPITAL_COMMUNITY): Payer: Self-pay | Admitting: Psychiatry

## 2014-07-19 DIAGNOSIS — F1420 Cocaine dependence, uncomplicated: Secondary | ICD-10-CM | POA: Diagnosis present

## 2014-07-19 DIAGNOSIS — F1390 Sedative, hypnotic, or anxiolytic use, unspecified, uncomplicated: Secondary | ICD-10-CM

## 2014-07-19 DIAGNOSIS — F1490 Cocaine use, unspecified, uncomplicated: Secondary | ICD-10-CM

## 2014-07-19 DIAGNOSIS — F332 Major depressive disorder, recurrent severe without psychotic features: Principal | ICD-10-CM

## 2014-07-19 MED ORDER — TRAMADOL HCL 50 MG PO TABS
50.0000 mg | ORAL_TABLET | Freq: Two times a day (BID) | ORAL | Status: DC | PRN
Start: 2014-07-19 — End: 2014-07-25
  Administered 2014-07-19 – 2014-07-21 (×2): 50 mg via ORAL
  Filled 2014-07-19 (×2): qty 1

## 2014-07-19 MED ORDER — IBUPROFEN 600 MG PO TABS
600.0000 mg | ORAL_TABLET | Freq: Four times a day (QID) | ORAL | Status: DC | PRN
Start: 2014-07-19 — End: 2014-07-25
  Administered 2014-07-23 – 2014-07-25 (×4): 600 mg via ORAL
  Filled 2014-07-19 (×4): qty 1

## 2014-07-19 MED ORDER — BUSPIRONE HCL 15 MG PO TABS
7.5000 mg | ORAL_TABLET | Freq: Three times a day (TID) | ORAL | Status: DC
  Administered 2014-07-19 – 2014-07-20 (×3): 7.5 mg via ORAL
  Filled 2014-07-19 (×6): qty 1

## 2014-07-19 MED ORDER — TRAZODONE HCL 50 MG PO TABS
50.0000 mg | ORAL_TABLET | Freq: Every day | ORAL | Status: DC
  Administered 2014-07-19: 50 mg via ORAL
  Filled 2014-07-19 (×3): qty 1

## 2014-07-19 MED ORDER — OLANZAPINE 5 MG PO TBDP
5.0000 mg | ORAL_TABLET | Freq: Four times a day (QID) | ORAL | Status: DC | PRN
  Administered 2014-07-23: 5 mg via ORAL
  Filled 2014-07-19 (×2): qty 1

## 2014-07-19 MED ORDER — TRAZODONE HCL 50 MG PO TABS
50.0000 mg | ORAL_TABLET | Freq: Every day | ORAL | Status: DC
  Filled 2014-07-19: qty 1

## 2014-07-19 MED ORDER — LORAZEPAM 1 MG PO TABS
1.0000 mg | ORAL_TABLET | Freq: Four times a day (QID) | ORAL | Status: AC | PRN
  Administered 2014-07-20: 1 mg via ORAL
  Filled 2014-07-19 (×2): qty 1

## 2014-07-19 MED ORDER — LOPERAMIDE HCL 2 MG PO CAPS: 2.0000 mg | ORAL_CAPSULE | ORAL | Status: AC | PRN

## 2014-07-19 MED ORDER — ONDANSETRON 4 MG PO TBDP: 4.0000 mg | ORAL_TABLET | Freq: Four times a day (QID) | ORAL | Status: AC | PRN

## 2014-07-19 MED ORDER — THIAMINE HCL 100 MG/ML IJ SOLN
100.0000 mg | Freq: Once | INTRAMUSCULAR | Status: AC
  Administered 2014-07-19: 100 mg via INTRAMUSCULAR
  Filled 2014-07-19: qty 2

## 2014-07-19 MED ORDER — OLANZAPINE 10 MG IM SOLR: 5.0000 mg | Freq: Four times a day (QID) | INTRAMUSCULAR | Status: DC | PRN

## 2014-07-19 MED ORDER — ADULT MULTIVITAMIN W/MINERALS CH
1.0000 | ORAL_TABLET | Freq: Every day | ORAL | Status: DC
  Administered 2014-07-19 – 2014-07-25 (×7): 1 via ORAL
  Filled 2014-07-19 (×9): qty 1

## 2014-07-19 MED ORDER — VITAMIN B-1 100 MG PO TABS
100.0000 mg | ORAL_TABLET | Freq: Every day | ORAL | Status: DC
  Administered 2014-07-20 – 2014-07-25 (×6): 100 mg via ORAL
  Filled 2014-07-19 (×7): qty 1

## 2014-07-19 NOTE — Progress Notes (Signed)
Pt presentation does not correspond with documentation provided earlier. Pt does not appear delusional, pt stated she was not chocking herself, but showing her daughter how she could do it if she wanted to. Pt has presented calm and cooperative and has not been agitated or irritable. Pt only complaint is Depression. PT concerned that family history of mental illness may need to be explored as she believes 7 out of 7 brothers and sisters all have some sort of mental illness.
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
## 2014-07-19 NOTE — Progress Notes (Signed)
Adult Psychoeducational Group Note  Date:  07/19/2014 Time:  9:36 PM  Group Topic/Focus:  Wrap-Up Group:   The focus of this group is to help patients review their daily goal of treatment and discuss progress on daily workbooks.  Participation Level:  Active  Participation Quality:  Attentive  Affect:  Appropriate  Cognitive:  Oriented  Insight: Limited  Engagement in Group:  Engaged  Modes of Intervention:  Socialization and Support  Additional Comments:  Patient attended and participated in group tonight. She reports having a good day. She went to her meals and attended her groups. She went off the unit with the group to the gym. The patient advised that she also called home and spoke with her mother, her husband and grandson visited with her. The patient stated that her peers are very helpful and encouraging. She felt lifted up today. She commended the staff and reports that she "feel safe being here".   Lita MainsFrancis, Sharda Keddy [facility] 07/19/2014, 9:36 PM

## 2014-07-19 NOTE — [facility] Suicide Risk Assessment (Signed)
   Nursing information obtained from:    Demographic factors:    Current Mental Status:    Loss Factors:    Historical Factors:    Risk Reduction Factors:    Total Time spent with patient: 45 minutes  CLINICAL FACTORS:   Alcohol/Substance Abuse/Dependencies Unstable or Poor Therapeutic Relationship Previous Psychiatric Diagnoses and Treatments  Psychiatric Specialty Exam: Physical Exam Please see H&P.   ROS  Blood pressure 106/40, pulse 115, temperature 98.3 F (36.8 C), temperature source Oral, resp. rate 16, height 5' 5.5" (1.664 m), weight 69.854 kg (154 lb).Body mass index is 25.23 kg/(m^2).   Please see H&P for MSE    SUICIDE RISK:   Moderate:  Frequent suicidal ideation with limited intensity, and duration, some specificity in terms of plans, no associated intent, good self-control, limited dysphoria/symptomatology, some risk factors present, and identifiable protective factors, including available and accessible social support.  PLAN OF CARE:Please see H&P.   I certify that inpatient services furnished can reasonably be expected to improve the patient's condition.  Verenis Nicosia md 07/19/2014, 11:56 AM
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
## 2014-07-19 NOTE — Progress Notes (Addendum)
D: Patient denies SI/HI and A/V hallucinations; patient reports sleep is good; reports appetite is fair; reports energy level is low; ; ; rates depression as 8/10; rates hopelessness as 5/10; patient reports " I dont want to kill myself, Im just tired and my kids just misunderstood me; I know I say some off things but I dont want to kill myself, I know they are trying to help but I need this time here"; patient also states " I need something to help me with my thoughts because they just keep going,fast,fast, fast"  A: Monitored q 15 minutes; patient encouraged to attend groups; patient educated about medications; patient given medications per physician orders; patient encouraged to express feelings and/or concerns  R: Patient is anxious but pleasant; patient cooperates and engages; patient is assertive and concern about getting what she needs; patient is animated and talks and engages with all staff; patient's interaction with staff and peers is appropriate to circumstnaces; patient was able to set goal to talk with staff 1:1 when having feelings of SI; patient is taking medications as prescribed and tolerating medications; patient is attending all groups

## 2014-07-19 NOTE — [facility] Group Notes (Signed)
[facility] LCSW Group Therapy  07/19/2014 2:18 PM  Type of Therapy:  Group Therapy  Participation Level:  Active  Participation Quality:  Attentive  Affect:  Appropriate  Cognitive:  Alert and Oriented  Insight:  Improving  Engagement in Therapy:  Engaged  Modes of Intervention:  Discussion, Education, Exploration, Problem-solving, Rapport Building, Socialization and Support  Summary of Progress/Problems: [facility] Speaker came to talk about his personal journey with substance abuse and mental illness. The pt processed ways by which to relate to the speaker. MHA speaker provided handouts and educational information pertaining to groups and services [facility]. Ashley Branch was attentive throughout group and shared a short story about her brother who was diagnosed with schizophrenia and used marijuana to help manage symptoms. "It was all that seemed to work." Ashley Branch was receptive to the speaker's opinions regarding marijuana use and short term vs long term effects of self medicating in this way. Pt appeared to enjoy music played by speaker on his guitar.    Ashley Branch, Ashley Branch LCSWA 07/19/2014, 2:18 PM

## 2014-07-19 NOTE — [facility] Group Notes (Signed)
[facility]   07/19/2014 10:02 AM  Participation Quality: Appropriate    Mood/Affect:  Appropriate  Depression Rating:  0  Anxiety Rating:  0  Thoughts of Suicide:  No Will you contract for safety?   NA  Current AVH:  No  Plan for Discharge/Comments:  Pt pleasant and calm during group. She shared that she was never suicidal prior to admission but "was exhausted from constantly caring for my husband who has cancer." Pt reports feeling overwhelmed with care-taking responsibilities and stated that her adult children were concerned because she was acting out of character. Pt reported that she goes to "a clinic" for medication management and is hoping to continue.   Transportation Means: family member  Supports: Software engineerfamily/children  Smart, OncologistHeather LCSWA

## 2014-07-20 MED ORDER — TRAZODONE HCL 100 MG PO TABS
100.0000 mg | ORAL_TABLET | Freq: Every day | ORAL | Status: DC
  Administered 2014-07-20 – 2014-07-24 (×5): 100 mg via ORAL
  Filled 2014-07-20 (×7): qty 1

## 2014-07-20 MED ORDER — FLUCONAZOLE 150 MG PO TABS
150.0000 mg | ORAL_TABLET | Freq: Once | ORAL | Status: AC
  Administered 2014-07-20: 150 mg via ORAL
  Filled 2014-07-20: qty 1

## 2014-07-20 MED ORDER — BUSPIRONE HCL 10 MG PO TABS
10.0000 mg | ORAL_TABLET | Freq: Three times a day (TID) | ORAL | Status: DC
  Administered 2014-07-20 – 2014-07-21 (×4): 10 mg via ORAL
  Filled 2014-07-20 (×6): qty 1
  Filled 2014-07-20: qty 2

## 2014-07-20 NOTE — Progress Notes (Signed)
D:Patient in the dayroom interacting with peers on approach.  Patient states her day was ok.  Patient states she visited with family and it went well.  Patient patient states she attended groups tonight and she learned a lot patient states she needs to take care of herself.  Patient denies SI/HI and denies AVH. A: Staff to monitor Q 15 mins for safety.  Encouragement and support offered.  Scheduled medications administered per orders.  Vistaril administered prn for anxiety R: Patient remains safe on the unit.  Patient attended group tonight.  Patient visible on the unit and interacting with peers.  Patient taking administered medications.

## 2014-07-20 NOTE — Progress Notes (Signed)
[facility] Group Notes:  (Nursing/MHT/Case Management/Adjunct)  Date:  07/20/2014  Time:  10:33 PM  Type of Therapy:  Group Therapy  Participation Level:  Active  Participation Quality:  Appropriate  Affect:  Appropriate  Cognitive:  Appropriate  Insight:  Appropriate  Engagement in Group:  Engaged  Modes of Intervention:  Socialization and Support  Summary of Progress/Problems: Pt. Stated her energy level was a 7.  Pt. Stated the visit with her husband brought her energy level down because he "does not understand my depression," and having to care for him.  Pt. Was encouraged to try to communicate and educate spouse about depression. Pt. Sated music and reading the bible were ways of coping with stress.  Sondra ComeWilson, Alinna Siple J 07/20/2014, 10:33 PM

## 2014-07-20 NOTE — Progress Notes (Signed)
Adult Psychoeducational Group Note  Date:  07/20/2014 Time:  10:45 AM  Group Topic/Focus:  Self Esteem Action Plan:   The focus of this group is to help patients create a plan to continue to build self-esteem after discharge.  Participation Level:  Active  Participation Quality:  Appropriate  Affect:  Appropriate  Cognitive:  Alert  Insight: Good  Engagement in Group:  Engaged  Modes of Intervention:  Discussion and Education  Additional Comments:  Pt was able to attend group this morning and participated well.  Zuhayr Deeney E 07/20/2014, 10:45 AM

## 2014-07-20 NOTE — Progress Notes (Signed)
Adult Psychoeducational Group Note  Date:  07/20/2014 Time:  0900 Group Topic/Focus:  Goals Group:   The focus of this group is to help patients establish daily goals to achieve during treatment and discuss how the patient can incorporate goal setting into their daily lives to aide in recovery.  Participation Level:  Active  Participation Quality:  Appropriate  Affect:  Appropriate  Cognitive:  Appropriate  Insight: Appropriate  Engagement in Group:  Engaged  Modes of Intervention:  Discussion and Education  Additional Comments:  Tawney Vanorman L 07/20/2014, 1:13 PM

## 2014-07-20 NOTE — Progress Notes (Addendum)
[facility]  07/20/2014 12:09 PM Anayely Fredricka BonineConnor  MRN:  [id number] Subjective:  Patient states,' I am feeling better." Objective:Patient seen and chart reviewed.Patient reports improvement in her mood sx, reports medications as effective and that she is making some progress. Patient reports sleep as improving. Patient discussed about how overwhelmed she felt at home 2/2 her boyfriend being diagnosed with cancer as well as herself having to take care of him day and night. Patient wants to focus on self and feels that her boyfriend will agree to make some changes. Pt today denies any SI/HI/AH/VH. Pt has been compliant on her medications. Denies withdrawal sx. Pt is participating in group activities and is motivated to get help. Vital signs reviewed -wnl  Diagnosis:   DSM5: Primary Psychiatric Diagnosis: Major depressive disorder ,recurrent ,severe without psychosis   Secondary Psychiatric Diagnosis: Cocaine use disorder,moderate Sedative hypnotic and anxiolytic use disorder,moderate   Non Psychiatric Diagnosis: See pmh   Total Time spent with patient: 30 minutes    ADL's:  Intact  Sleep: Fair  Appetite:  Fair   Psychiatric Specialty Exam: Physical Exam  ROS  Blood pressure 112/63, pulse 97, temperature 98.7 F (37.1 C), temperature source Oral, resp. rate 18, height 5' 5.5" (1.664 m), weight 69.854 kg (154 lb).Body mass index is 25.23 kg/(m^2).  General Appearance: Casual  Eye Contact::  Good  Speech:  Clear and Coherent  Volume:  Normal  Mood:  Anxious, Depressed and improving  Affect:  Depressed and Labile  Thought Process:  Goal Directed  Orientation:  Full (Time, Place, and Person)  Thought Content:  WDL  Suicidal Thoughts:  No  Homicidal Thoughts:  No  Memory:  Immediate;   Fair Recent;   Fair Remote;   Fair  Judgement:  Impaired  Insight:  Shallow  Psychomotor Activity:  Restlessness  Concentration:  Fair  Recall:  Fis[employer]Fair  Fund of Knowledge:Fair   Language: Good  Akathisia:  No  Handed:  Right  AIMS (if indicated):     Assets:  Communication Skills Desire for Improvement  Sleep:  Number of Hours: 5.25   Musculoskeletal: Strength & Muscle Tone: within normal limits Gait & Station: normal Patient leans: N/A  Current Medications: Current Facility-Administered Medications  Medication Dose Route Frequency Provider Last Rate Last Dose  . alum & mag hydroxide-simeth (MAALOX/MYLANTA) 200-200-20 MG/5ML suspension 30 mL  30 mL Oral Q4H PRN Kerry HoughSpencer E Simon, PA-C   30 mL at 07/20/14 0053  . busPIRone (BUSPAR) tablet 10 mg  10 mg Oral TID Jomarie LongsSaramma Texie Tupou, MD   10 mg at 07/20/14 1148  . famotidine (PEPCID) tablet 20 mg  20 mg Oral BID Kerry HoughSpencer E Simon, PA-C   20 mg at 07/20/14 [id number]  . FLUoxetine (PROZAC) capsule 80 mg  80 mg Oral Daily Kerry HoughSpencer E Simon, PA-C   80 mg at 07/20/14 0801  . hydrOXYzine (ATARAX/VISTARIL) tablet 25 mg  25 mg Oral Q6H PRN Kerry HoughSpencer E Simon, PA-C   25 mg at 07/19/14 2221  . ibuprofen (ADVIL,MOTRIN) tablet 600 mg  600 mg Oral Q6H PRN Jomarie LongsSaramma Dilon Lank, MD      . lisinopril (PRINIVIL,ZESTRIL) tablet 5 mg  5 mg Oral Daily Kerry HoughSpencer E Simon, PA-C   5 mg at 07/20/14 0800  . loperamide (IMODIUM) capsule 2-4 mg  2-4 mg Oral PRN Jomarie LongsSaramma Adline Kirshenbaum, MD      . loratadine (CLARITIN) tablet 10 mg  10 mg Oral Daily Kerry HoughSpencer E Simon, PA-C   10 mg at 07/20/14 [id number]  .  LORazepam (ATIVAN) tablet 1 mg  1 mg Oral Q6H PRN Jomarie LongsSaramma Naoma Boxell, MD   1 mg at 07/20/14 0039  . magnesium hydroxide (MILK OF MAGNESIA) suspension 30 mL  30 mL Oral Daily PRN Kerry HoughSpencer E Simon, PA-C      . multivitamin with minerals tablet 1 tablet  1 tablet Oral Daily Jomarie LongsSaramma Elaya Droege, MD   1 tablet at 07/20/14 0801  . OLANZapine zydis (ZYPREXA) disintegrating tablet 5 mg  5 mg Oral Q6H PRN Jomarie LongsSaramma Lanorris Kalisz, MD       Or  . OLANZapine (ZYPREXA) injection 5 mg  5 mg Intramuscular Q6H PRN Mung Rinker, MD      . ondansetron (ZOFRAN-ODT) disintegrating tablet 4 mg  4 mg Oral Q6H PRN Jomarie LongsSaramma  Umair Rosiles, MD      . promethazine (PHENERGAN) tablet 50 mg  50 mg Oral Q6H PRN Kerry HoughSpencer E Simon, PA-C      . sulfamethoxazole-trimethoprim (BACTRIM,SEPTRA) 400-80 MG per tablet 1 tablet  1 tablet Oral Q12H Kerry HoughSpencer E Simon, PA-C   1 tablet at 07/20/14 0800  . thiamine (VITAMIN B-1) tablet 100 mg  100 mg Oral Daily Jomarie LongsSaramma Chetan Mehring, MD   100 mg at 07/20/14 0802  . traMADol (ULTRAM) tablet 50 mg  50 mg Oral Q12H PRN Jomarie LongsSaramma Zyona Pettaway, MD   50 mg at 07/19/14 1309  . traZODone (DESYREL) tablet 100 mg  100 mg Oral QHS Jomarie LongsSaramma Nakai Yard, MD        Lab Results: No results found for this or any previous visit (from the past 48 hour(s)).  Physical Findings: AIMS: Facial and Oral Movements Muscles of Facial Expression: None, normal Lips and Perioral Area: None, normal Jaw: None, normal Tongue: None, normal,Extremity Movements Upper (arms, wrists, hands, fingers): None, normal Lower (legs, knees, ankles, toes): None, normal, Trunk Movements Neck, shoulders, hips: None, normal, Overall Severity Severity of abnormal movements (highest score from questions above): None, normal Incapacitation due to abnormal movements: None, normal Patient's awareness of abnormal movements (rate only patient's report): No Awareness, Dental Status Current problems with teeth and/or dentures?: Yes (no teeth) Does patient usually wear dentures?: No  CIWA:  CIWA-Ar Total: 1 COWS:  COWS Total Score: 0  Treatment Plan Summary: Daily contact with patient to assess and evaluate symptoms and progress in treatment Medication management  Plan: Will continue Prozac 80 mg po daily. Patient wants to stay on the same. Will increase Buspar to 10 mg po tid. Will continue CIWA And Ativan prn . Will increaseTrazodone to 100 mg po qhs for sleep.  Will continue to monitor vitals ,medication compliance and treatment side effects while patient is here.  Will monitor for medical issues as well as call consult as needed. Patient with dental  problems. Will manage pain as needed. Continue Bactrim as scheduled for UTI. Reviewed labs ,will order as needed. TSH ordered. CSW will start working on disposition.  Patient to participate in therapeutic milieu  Medical Decision Making Problem Points:  Established problem, stable/improving (1), Review of last therapy session (1) and Review of psycho-social stressors (1) Data Points:  Review or order medicine tests (1) Review of medication regiment & side effects (2) Review of new medications or change in dosage (2)  I certify that inpatient services furnished can reasonably be expected to improve the patient's condition.   Shanitra Phillippi MD 07/20/2014, 12:09 PM

## 2014-07-20 NOTE — Progress Notes (Signed)
D: Pt denies SI/HI/AVH. Pt is pleasant and cooperative. Pt stated " I feel like a new person, I'm not ready to go home yet, I need more rest". Pt said she felt good seeing her husband today.   A: Pt was offered support and encouragement. Pt was given scheduled medications. Pt was encourage to attend groups. Q 15 minute checks were done for safety.   R:Pt attends groups and interacts well with peers and staff. Pt is taking medication. Pt has no complaints at this time .Pt receptive to treatment and safety maintained on unit.

## 2014-07-20 NOTE — [facility] Suicide Risk Assessment (Signed)
[facility] INPATIENT:  Family/Significant Other Suicide Prevention Education  Suicide Prevention Education:  Contact Attempts: Donata ClayRaleigh Martin (pt's boyfriend) [phone] has been identified by the patient as the family member/significant other with whom the patient will be residing, and identified as the person(s) who will aid the patient in the event of a mental health crisis.  With written consent from the patient, two attempts were made to provide suicide prevention education, prior to and/or following the patient's discharge.  We were unsuccessful in providing suicide prevention education.  A suicide education pamphlet was given to the patient to share with family/significant other.  Date and time of first attempt: 07/20/14 (voicemail left requesting call back) 10:00AM Date and time of second attempt: 07/21/14 3:00PM (2nd voicemail left)   Smart, Manual Navarra LCSWA  07/20/2014, 10:01 AM

## 2014-07-20 NOTE — Progress Notes (Signed)
Patient ID: Ashley Branch, female   DOB: 03/16/1951, 63 y.o.   MRN: [id number]  D: Pt currently presents with an appropriate affect and eupohoric behavior. Pt states in group that "everyone gets along so well and th Per self inventory, pt rates depression at a 5, hopelessness 0 and anxiety 4. Pt's daily goal is to "very attached to family" and they intend to do so by "don't call them and don't let them see me." Pt reports fair sleep, good concentration and good appetite.     A: Pt provided with medications per providers orders. Pt's labs and vitals were monitored throughout the day. Pt supported emotionally and encouraged to express concerns and questions. Pt consulted with provider and Clinical research associatewriter. Pt educated on coping skills and stress management.   R: Pt's safety ensured with 15 minute and environmental checks. Pt currently denies SI/HI and A/V hallucinations. Pt verbally agrees to seek staff if SI/HI or A/VH occurs and to consult with staff before acting on these thoughts.    Aurora Maskwyman, Berkley Wrightsman E, RN
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
## 2014-07-20 NOTE — [facility] Group Notes (Signed)
[facility] LCSW Group Therapy  07/20/2014 1:30 PM   Type of Therapy:  Group Therapy  Participation Level:  Active  Participation Quality:  Attentive  Affect:  Appropriate  Cognitive:  Appropriate  Insight:  Improving  Engagement in Therapy:  Engaged  Modes of Intervention:  Clarification, Education, Exploration and Socialization  Summary of Progress/Problems: Today's group focused on relapse prevention.  We defined the term, and then brainstormed on ways to prevent relapse.  "Recovery is being aware that you can't do all the things that your mind tells you to do.  I'm getting meds that help my mind slowed down, but I also have to practice doing it as well.  Recovery is easier when I have the support of other people."  Identified triggers of "so called friends" and human relationship and loss.  Cited the group yesterday and the support groups that she heard about.  "I'm going to join a group because I need positive supports, especially in case something bad happens, cause that's when I will really relapse.  Ashley Branch, Ashley Branch 07/20/2014 , 1:30 PM

## 2014-07-21 LAB — TSH: TSH: 0.412 u[IU]/mL (ref 0.350–4.500)

## 2014-07-21 MED ORDER — BUSPIRONE HCL 15 MG PO TABS
15.0000 mg | ORAL_TABLET | Freq: Three times a day (TID) | ORAL | Status: DC
  Administered 2014-07-21 – 2014-07-24 (×8): 15 mg via ORAL
  Filled 2014-07-21 (×11): qty 1

## 2014-07-21 NOTE — Progress Notes (Signed)
Patient ID: Sheria LangDelphine Schwall, female   DOB: 03/13/1951, 63 y.o.   MRN: [id number]  D; Pt currently presents with an appropriate affect and anxious behavior. Pt is very supportive of the other pt's on the unit. Pt socializes with other pts and attends groups. Per self inventory, pt rates depression at a 0, hopelessness 0 and anxiety 10. Pt's daily goal is to "try to get home." Pt writes "staff have been very professional. Answer my questions completely." Pt reports good sleep, appetite, and concentration.  A; Pt provided with medications per providers orders. Pt's labs and vitals were monitored throughout the day. Pt supported emotionally and encouraged to express concerns and questions. Pt consulted with Child psychotherapistsocial worker, Landproivder and writer. Pt educated on coping skills and medications.   R: Pt's safety ensured with 15 minute and environmental checks. Pt currently denies SI/HI and A/V hallucinations. Pt verbally agrees to seek staff if SI/HI or A/VH occurs and to consult with staff before acting on these thoughts. Pt states that she "does not want to go home and worry about my husbands problems all the time. I would like to see my grand daughter sing in a play, she was in a tyler perry play at her school recently. She can sing beautifully."   Twyman, Sung AmabileSara E, RN

## 2014-07-21 NOTE — [facility] Group Notes (Signed)
[facility] LCSW Group Therapy  07/21/2014 1:33 PM  Type of Therapy:  Group Therapy  Participation Level:  Active  Participation Quality:  Attentive  Affect:  Appropriate  Cognitive:  Alert and Oriented  Insight:  Improving  Engagement in Therapy:  Improving  Modes of Intervention:  Discussion, Education, Exploration, Limit-setting, Problem-solving, Rapport Building, Socialization and Support  Summary of Progress/Problems: OrthoptistChaplain and intern facilitated today's group. Group members were asked to choose a card that represented how they feel today. Group members were then asked to share why they chose these card and what the picture symbolized to them. Group members were encouraged to process each members' card and how it relates to their experience. Ashley Branch was attentive and engaged during today's processing group. She shared a picture of multicolored balloons with the group, stating that "I like colors." "Colors represent different things to me, like purple means royalty." Ashley Branch identified with another group members' picture symbolozing community and stated that "we are all here to get help and it feels so good to be here in the care of people who care about me."   Smart, Ashley Branch LCSWA 07/21/2014, 1:33 PM

## 2014-07-21 NOTE — Progress Notes (Addendum)
Follow up for continued support around illness, caregiving for significant other with cancer.

## 2014-07-21 NOTE — Progress Notes (Signed)
[facility] Group Notes:  (Nursing/MHT/Case Management/Adjunct)  Date:  07/21/2014  Time:  9:34 PM  Type of Therapy:  Group Therapy  Participation Level:  Active  Participation Quality:  Appropriate, Attentive and Sharing  Affect:  Appropriate  Cognitive:  Alert and Appropriate  Insight:  Improving  Engagement in Group:  Developing/Improving  Modes of Intervention:  Socialization and Support  Summary of Progress/Problems: Pt. Participated in group discussion.  Pt. Stated she enjoyed talking to peers on unit.  Pt. Stated she begins to withdrawal from normal activities (gardening) and isolation were early warning signs of relapse. Sondra ComeWilson, Yug Loria J 07/21/2014, 9:34 PM

## 2014-07-21 NOTE — [facility] Group Notes (Signed)
[facility]   07/21/2014 11:33 AM  Participation Quality:  Appropriate   Mood/Affect:  Appropriate  Depression Rating:  0  Anxiety Rating:  0  Thoughts of Suicide:  No Will you contract for safety?   NA  Current AVH:  No  Plan for Discharge/Comments:  Pt reports that she is feeling better but still feels mentally and physically drained and "I need more time here." Pt reports that [facility] and [facility] for therapy--appt scheduled.   Transportation Means: family member or bus   Supports: adult children, S/O  Counselling psychologistmart, OncologistHeather LCSWA

## 2014-07-21 NOTE — Progress Notes (Signed)
[facility]  07/21/2014 12:06 PM Ashley Branch  MRN:  [id number] Subjective:  Patient states,' I am OK ,but I still feel not ready to go home, I am emotionally drained." Objective:Patient seen and chart reviewed.Patient continues to report feeling drained and burned out 2/2 taking care of her boyfriend who has cancer stage 3. Patient reports that she felt very depressed as well as anxious at the times of admission. However she reports feeling improvement in her mood sx ,but not quiet ready yet. Pt reassured ,provided supportive therapy as to how she should be creative enough to find some help for herself while taking care of her boyfriend who had cancer. Pt has been compliant on her medications. Denies withdrawal sx. Pt is participating in group activities and is motivated to get help. Vital signs reviewed -wnl  Patient reports some vaginal itching - reported it is improving on diflucan.  Diagnosis:   DSM5: Primary Psychiatric Diagnosis: Major depressive disorder ,recurrent ,severe without psychosis   Secondary Psychiatric Diagnosis: Cocaine use disorder,moderate Sedative hypnotic and anxiolytic use disorder,moderate   Non Psychiatric Diagnosis: See pmh   Total Time spent with patient: 30 minutes    ADL's:  Intact  Sleep: Fair  Appetite:  Fair   Psychiatric Specialty Exam: Physical Exam  ROS  Blood pressure 103/61, pulse 100, temperature 99.4 F (37.4 C), temperature source Oral, resp. rate 16, height 5' 5.5" (1.664 m), weight 69.854 kg (154 lb).Body mass index is 25.23 kg/(m^2).  General Appearance: Casual  Eye Contact::  Good  Speech:  Clear and Coherent  Volume:  Normal  Mood:  Anxious, Depressed and improving  Affect:  Depressed and Labile  Thought Process:  Goal Directed  Orientation:  Full (Time, Place, and Person)  Thought Content:  WDL  Suicidal Thoughts:  No  Homicidal Thoughts:  No  Memory:  Immediate;   Fair Recent;   Fair Remote;   Fair   Judgement:  Impaired  Insight:  Shallow  Psychomotor Activity:  Restlessness  Concentration:  Fair  Recall:  [employer]  Fund of Knowledge:Fair  Language: Good  Akathisia:  No  Handed:  Right  AIMS (if indicated):     Assets:  Communication Skills Desire for Improvement  Sleep:  Number of Hours: 5.25   Musculoskeletal: Strength & Muscle Tone: within normal limits Gait & Station: normal Patient leans: N/A  Current Medications: Current Facility-Administered Medications  Medication Dose Route Frequency Provider Last Rate Last Dose  . alum & mag hydroxide-simeth (MAALOX/MYLANTA) 200-200-20 MG/5ML suspension 30 mL  30 mL Oral Q4H PRN Kerry HoughSpencer E Simon, PA-C   30 mL at 07/20/14 0053  . busPIRone (BUSPAR) tablet 15 mg  15 mg Oral TID Jomarie LongsSaramma Ayomide Purdy, MD      . famotidine (PEPCID) tablet 20 mg  20 mg Oral BID Kerry HoughSpencer E Simon, PA-C   20 mg at 07/21/14 [id number]  . FLUoxetine (PROZAC) capsule 80 mg  80 mg Oral Daily Kerry HoughSpencer E Simon, PA-C   80 mg at 07/21/14 0820  . hydrOXYzine (ATARAX/VISTARIL) tablet 25 mg  25 mg Oral Q6H PRN Kerry HoughSpencer E Simon, PA-C   25 mg at 07/20/14 2207  . ibuprofen (ADVIL,MOTRIN) tablet 600 mg  600 mg Oral Q6H PRN Jomarie LongsSaramma Nyree Applegate, MD      . lisinopril (PRINIVIL,ZESTRIL) tablet 5 mg  5 mg Oral Daily Kerry HoughSpencer E Simon, PA-C   Stopped at 07/21/14 0800  . loperamide (IMODIUM) capsule 2-4 mg  2-4 mg Oral PRN Jomarie LongsSaramma Quinnie Barcelo, MD      .  loratadine (CLARITIN) tablet 10 mg  10 mg Oral Daily Kerry Hough, PA-C   10 mg at 07/21/14 1610  . LORazepam (ATIVAN) tablet 1 mg  1 mg Oral Q6H PRN Jomarie Longs, MD   1 mg at 07/20/14 0039  . magnesium hydroxide (MILK OF MAGNESIA) suspension 30 mL  30 mL Oral Daily PRN Kerry Hough, PA-C      . multivitamin with minerals tablet 1 tablet  1 tablet Oral Daily Jomarie Longs, MD   1 tablet at 07/21/14 0824  . OLANZapine zydis (ZYPREXA) disintegrating tablet 5 mg  5 mg Oral Q6H PRN Jomarie Longs, MD       Or  . OLANZapine (ZYPREXA) injection 5 mg  5 mg  Intramuscular Q6H PRN Lindsie Simar, MD      . ondansetron (ZOFRAN-ODT) disintegrating tablet 4 mg  4 mg Oral Q6H PRN Jomarie Longs, MD      . promethazine (PHENERGAN) tablet 50 mg  50 mg Oral Q6H PRN Kerry Hough, PA-C      . sulfamethoxazole-trimethoprim (BACTRIM,SEPTRA) 400-80 MG per tablet 1 tablet  1 tablet Oral Q12H Kerry Hough, PA-C   1 tablet at 07/21/14 0820  . thiamine (VITAMIN B-1) tablet 100 mg  100 mg Oral Daily Jomarie Longs, MD   100 mg at 07/21/14 0821  . traMADol (ULTRAM) tablet 50 mg  50 mg Oral Q12H PRN Jomarie Longs, MD   50 mg at 07/21/14 1101  . traZODone (DESYREL) tablet 100 mg  100 mg Oral QHS Jomarie Longs, MD   100 mg at 07/20/14 2207    Lab Results: No results found for this or any previous visit (from the past 48 hour(s)).  Physical Findings: AIMS: Facial and Oral Movements Muscles of Facial Expression: None, normal Lips and Perioral Area: None, normal Jaw: None, normal Tongue: None, normal,Extremity Movements Upper (arms, wrists, hands, fingers): None, normal Lower (legs, knees, ankles, toes): None, normal, Trunk Movements Neck, shoulders, hips: None, normal, Overall Severity Severity of abnormal movements (highest score from questions above): None, normal Incapacitation due to abnormal movements: None, normal Patient's awareness of abnormal movements (rate only patient's report): No Awareness, Dental Status Current problems with teeth and/or dentures?: Yes (no teeth) Does patient usually wear dentures?: No  CIWA:  CIWA-Ar Total: 0 COWS:  COWS Total Score: 0  Treatment Plan Summary: Daily contact with patient to assess and evaluate symptoms and progress in treatment Medication management  Plan: Will continue Prozac 80 mg po daily. Patient wants to stay on the same. Will increase Buspar to 15  mg po tid. Will continue CIWA And Ativan prn . Will continue Trazodone to 100 mg po qhs for sleep.  Will continue to monitor vitals ,medication  compliance and treatment side effects while patient is here.  Will monitor for medical issues as well as call consult as needed. Patient with dental problems. Will manage pain as needed. Continue Bactrim as scheduled for UTI.Diflucan 150 mg x1 dose for vaginal candidiasis.  Reviewed labs ,will order as needed. TSH ordered. CSW will start working on disposition.  Patient to participate in therapeutic milieu  Medical Decision Making Problem Points:  Established problem, stable/improving (1), Review of last therapy session (1) and Review of psycho-social stressors (1) Data Points:  Review or order medicine tests (1) Review of medication regiment & side effects (2) Review of new medications or change in dosage (2)  I certify that inpatient services furnished can reasonably be expected to improve the patient's condition.  Myer Bohlman MD 07/21/2014, 12:06 PM

## 2014-07-22 MED ORDER — FLUTICASONE PROPIONATE 50 MCG/ACT NA SUSP
NASAL | Status: AC
  Administered 2014-07-22: 2 via NASAL
  Filled 2014-07-22: qty 16

## 2014-07-22 MED ORDER — DIPHENHYDRAMINE HCL 25 MG PO CAPS
25.0000 mg | ORAL_CAPSULE | Freq: Every evening | ORAL | Status: DC | PRN
  Administered 2014-07-22 – 2014-07-24 (×3): 25 mg via ORAL
  Filled 2014-07-22 (×3): qty 1

## 2014-07-22 MED ORDER — DIPHENHYDRAMINE HCL 50 MG/ML IJ SOLN: 25.0000 mg | Freq: Every evening | INTRAMUSCULAR | Status: DC | PRN

## 2014-07-22 MED ORDER — FLUTICASONE PROPIONATE 50 MCG/ACT NA SUSP
2.0000 | Freq: Every day | NASAL | Status: DC
  Administered 2014-07-22 – 2014-07-25 (×4): 2 via NASAL
  Filled 2014-07-22: qty 16

## 2014-07-22 NOTE — Plan of Care (Signed)
Problem: Ineffective individual coping Goal: STG: Patient will remain free from self harm Outcome: Progressing Pt has not harmed herself this shift.  She denies thoughts of self-harm and verbally contracted for safety.

## 2014-07-22 NOTE — Progress Notes (Signed)
Adult Psychoeducational Group Note  Date:  07/22/2014 Time:  9:25 PM  Group Topic/Focus:  Wrap-Up Group:   The focus of this group is to help patients review their daily goal of treatment and discuss progress on daily workbooks.  Participation Level:  Active  Participation Quality:  Appropriate  Affect:  Appropriate  Cognitive:  Appropriate  Insight: Appropriate  Engagement in Group:  Engaged  Modes of Intervention:  Discussion  Additional Comments: The patient expressed that it she did better in group with listening.The patient  also said that she did not talk in group and that better for her.  Octavio Mannshigpen, Breven Guidroz Lee 07/22/2014, 9:25 PM

## 2014-07-22 NOTE — Progress Notes (Signed)
D: Pt has euphoric affect and anxious mood.  Pt denies SI/HI, denies hallucinations.  Pt reports her day was "so good that I could shout."  Pt reports her goal for the day was "to have a good day and relax."  Pt expressed concern related to her husband.  She reported that he has a clot in his leg and she is worried about him.  Pt reports that her husband visited and that it was a good visit.  Pt attended evening group and interacts with peers and staff appropriately.   A: Medications administered per order.  PRN medication administered for anxiety, see flowsheet.  Supported and encouraged pt.  Safety maintained.   R: Pt is compliant with medications.  She verbally contracts for safety.  Pt reports she will notify writer of needs and concerns.  Will continue to monitor and assess for safety.

## 2014-07-22 NOTE — Progress Notes (Signed)
Patient ID: Ashley Branch, female   DOB: 01/06/1951, 63 y.o.   MRN: [id number] D: Patient has bright affect this morning.  She has been joking with staff and her peers.  She is attended group this morning and participated.  Her speech remains tangental.  She rates her depression and hopelessness as a 0; anxiety as an 8.  She denies any SI/HI/AVH.  Her goal today is to "follow all the rules."   A: Continue to monitor medication management and MD orders.  Safety checks completed every 15 minutes per protocol. R: Patient's behavior is appropriate to situation.

## 2014-07-22 NOTE — Progress Notes (Signed)
Patient ID: Ashley LangDelphine Spratling, female   DOB: 08/30/1950, 63 y.o.   MRN: [id number] [facility]  07/22/2014 12:14 PM Lahoma Fredricka BonineConnor  MRN:  [id number] Subjective:  Patient states,' I am OK ,but I still feel not ready to go home, I am emotionally drained." Objective:Patient seen and chart reviewed.Patient continues to report feeling drained and burned out 2/2 taking care of her boyfriend who has cancer stage 3. Patient reports that she felt very depressed as well as anxious at the times of admission. However she reports feeling improvement in her mood sx ,but not quiet ready yet. She remains evasive but pleasant. Shift from topic and easily distracted. Pt reassured ,provided supportive therapy as to how she should be creative enough to find some help for herself while taking care of her boyfriend who had cancer. Pt has been compliant on her medications. Denies withdrawal sx. Pt is participating in group activities and is motivated to get help. Vital signs reviewed -wnl  .  Diagnosis:   DSM5: Primary Psychiatric Diagnosis: Major depressive disorder ,recurrent ,severe without psychosis   Secondary Psychiatric Diagnosis: Cocaine use disorder,moderate Sedative hypnotic and anxiolytic use disorder,moderate   Non Psychiatric Diagnosis: See pmh   Total Time spent with patient: 30 minutes    ADL's:  Intact  Sleep: Fair  Appetite:  Fair   Psychiatric Specialty Exam: Physical Exam  ROS  Blood pressure 117/88, pulse 92, temperature 98.5 F (36.9 C), temperature source Oral, resp. rate 16, height 5' 5.5" (1.664 m), weight 69.854 kg (154 lb).Body mass index is 25.23 kg/(m^2).  General Appearance: Casual  Eye Contact::  Good  Speech:  Clear and Coherent  Volume:  Normal  Mood:  Anxious, Depressed and improving  Affect:  Depressed and Labile  Thought Process:  Somewhat circumstantial  Orientation:  Full (Time, Place, and Person)  Thought Content:  WDL  Suicidal Thoughts:  No   Homicidal Thoughts:  No  Memory:  Immediate;   Fair Recent;   Fair Remote;   Fair  Judgement:  Impaired  Insight:  Shallow  Psychomotor Activity:  Restlessness  Concentration:  Fair  Recall:  [employer]  Fund of Knowledge:Fair  Language: Good  Akathisia:  No  Handed:  Right  AIMS (if indicated):     Assets:  Communication Skills Desire for Improvement  Sleep:  Number of Hours: 5.25   Musculoskeletal: Strength & Muscle Tone: within normal limits Gait & Station: normal Patient leans: N/A  Current Medications: Current Facility-Administered Medications  Medication Dose Route Frequency Provider Last Rate Last Dose  . alum & mag hydroxide-simeth (MAALOX/MYLANTA) 200-200-20 MG/5ML suspension 30 mL  30 mL Oral Q4H PRN Kerry HoughSpencer E Simon, PA-C   30 mL at 07/20/14 0053  . busPIRone (BUSPAR) tablet 15 mg  15 mg Oral TID Jomarie LongsSaramma Eappen, MD   15 mg at 07/22/14 1204  . famotidine (PEPCID) tablet 20 mg  20 mg Oral BID Kerry HoughSpencer E Simon, PA-C   20 mg at 07/22/14 [id number]  . FLUoxetine (PROZAC) capsule 80 mg  80 mg Oral Daily Kerry HoughSpencer E Simon, PA-C   80 mg at 07/22/14 [id number]  . hydrOXYzine (ATARAX/VISTARIL) tablet 25 mg  25 mg Oral Q6H PRN Kerry HoughSpencer E Simon, PA-C   25 mg at 07/21/14 2252  . ibuprofen (ADVIL,MOTRIN) tablet 600 mg  600 mg Oral Q6H PRN Jomarie LongsSaramma Eappen, MD      . lisinopril (PRINIVIL,ZESTRIL) tablet 5 mg  5 mg Oral Daily Kerry HoughSpencer E Simon, PA-C   5 mg at 07/22/14  [id number]  . loperamide (IMODIUM) capsule 2-4 mg  2-4 mg Oral PRN Jomarie LongsSaramma Eappen, MD      . loratadine (CLARITIN) tablet 10 mg  10 mg Oral Daily Kerry HoughSpencer E Simon, PA-C   10 mg at 07/22/14 [id number]  . LORazepam (ATIVAN) tablet 1 mg  1 mg Oral Q6H PRN Jomarie LongsSaramma Eappen, MD   1 mg at 07/20/14 0039  . magnesium hydroxide (MILK OF MAGNESIA) suspension 30 mL  30 mL Oral Daily PRN Kerry HoughSpencer E Simon, PA-C      . multivitamin with minerals tablet 1 tablet  1 tablet Oral Daily Jomarie LongsSaramma Eappen, MD   1 tablet at 07/22/14 0807  . OLANZapine zydis (ZYPREXA) disintegrating  tablet 5 mg  5 mg Oral Q6H PRN Jomarie LongsSaramma Eappen, MD       Or  . OLANZapine (ZYPREXA) injection 5 mg  5 mg Intramuscular Q6H PRN Saramma Eappen, MD      . ondansetron (ZOFRAN-ODT) disintegrating tablet 4 mg  4 mg Oral Q6H PRN Jomarie LongsSaramma Eappen, MD      . promethazine (PHENERGAN) tablet 50 mg  50 mg Oral Q6H PRN Kerry HoughSpencer E Simon, PA-C      . sulfamethoxazole-trimethoprim (BACTRIM,SEPTRA) 400-80 MG per tablet 1 tablet  1 tablet Oral Q12H Kerry HoughSpencer E Simon, PA-C   1 tablet at 07/22/14 [phone]  . thiamine (VITAMIN B-1) tablet 100 mg  100 mg Oral Daily Jomarie LongsSaramma Eappen, MD   100 mg at 07/22/14 0808  . traMADol (ULTRAM) tablet 50 mg  50 mg Oral Q12H PRN Jomarie LongsSaramma Eappen, MD   50 mg at 07/21/14 1101  . traZODone (DESYREL) tablet 100 mg  100 mg Oral QHS Jomarie LongsSaramma Eappen, MD   100 mg at 07/21/14 2252    Lab Results:  Results for orders placed or performed during the hospital encounter of 07/18/14 (from the past 48 hour(s))  TSH     Status: None   Collection Time: 07/21/14  7:41 PM  Result Value Ref Range   TSH 0.412 0.350 - 4.500 uIU/mL    Comment[facility]    Physical Findings: AIMS: Facial and Oral Movements Muscles of Facial Expression: None, normal Lips and Perioral Area: None, normal Jaw: None, normal Tongue: None, normal,Extremity Movements Upper (arms, wrists, hands, fingers): None, normal Lower (legs, knees, ankles, toes): None, normal, Trunk Movements Neck, shoulders, hips: None, normal, Overall Severity Severity of abnormal movements (highest score from questions above): None, normal Incapacitation due to abnormal movements: None, normal Patient's awareness of abnormal movements (rate only patient's report): No Awareness, Dental Status Current problems with teeth and/or dentures?: Yes (no teeth) Does patient usually wear dentures?: No  CIWA:  CIWA-Ar Total: 3 COWS:  COWS Total Score: 0  Treatment Plan Summary: Daily contact with patient to assess and evaluate symptoms and  progress in treatment Medication management  Plan: Continue current dose of Prozac. Supportive therapy  Reviewed labs ,will order as needed. TSH ordered. CSW will start working on disposition.  Patient to participate in therapeutic milieu  Medical Decision Making Problem Points:  Established problem, stable/improving (1), Review of last therapy session (1) and Review of psycho-social stressors (1) Data Points:  Review or order medicine tests (1) Review of medication regiment & side effects (2) Review of new medications or change in dosage (2)  I certify that inpatient services furnished can reasonably be expected to improve the patient's condition.   Gilmore LarocheAKHTAR, Deloy Archey MD 07/22/2014, 12:14 PM

## 2014-07-22 NOTE — [facility] Group Notes (Signed)
[facility] Group Notes:  (Nursing/MHT/Case Management/Adjunct)  Date:  07/22/2014  Time: 0930 am  Type of Therapy:  Psychoeducational Skills  Participation Level:  Active  Participation Quality:  Sharing  Affect:  Appropriate  Cognitive:  Alert  Insight:  Improving  Engagement in Group:  Off Topic  Modes of Intervention:  Support  Summary of Progress/Problems:  Cranford MonBeaudry, Rudolfo Brandow Evans 07/22/2014, 10:39 AM

## 2014-07-22 NOTE — [facility] Group Notes (Signed)
[facility] LCSW Group Therapy Note  07/22/2014 11:15 AM  Type of Therapy and Topic:  Group Therapy: Avoiding Self-Sabotaging and Enabling Behaviors  Participation Level:  Active  Mood: Anxious yet talkative  Description of Group:     Learn how to identify obstacles, self-sabotaging and enabling behaviors, what are they, why do we do them and what needs do these behaviors meet? Discuss unhealthy relationships and how to have positive healthy boundaries with those that sabotage and enable. Explore aspects of self-sabotage and enabling in yourself and how to limit these self-destructive behaviors in everyday life. A scaling question is used to help patient look at where they are now in their motivation to change, from 1 to 10 (lowest to highest motivation).  Therapeutic Goals: 1. Patient will identify one obstacle that relates to self-sabotage and enabling behaviors 2. Patient will identify one personal self-sabotaging or enabling behavior they did prior to admission 3. Patient able to establish a plan to change the above identified behavior they did prior to admission:  4. Patient will demonstrate ability to communicate their needs through discussion and/or role plays.   Summary of Patient Progress: The main focus of today's process group was to discuss what "self-sabotage" means and use Motivational Interviewing to discuss what benefits, negative or positive, were involved in a self-identified self-sabotaging behavior. We then talked about reasons the patient may want to change the behavior and his/her current desire to change. A scaling question was used to help patient look at where they are now in motivation for change, from 1 to 10 (lowest to highest motivation).  Patient shared during group warm up she is feels torn about the holidays as she has been exhausted caring for significant other and has no energy to participate in usual traditions. She is however looking forward to being with friends and  taking care of herself.  Patient was intrusive at several points and unresponsive to redirection. She shared empathically about instances of disagreements with mother and significant other and presenting herself as victim ("I take Prozac for goodness sake").    Therapeutic Modalities:   Cognitive Behavioral Therapy Person-Centered Therapy Motivational Interviewing   Carney Bernatherine C Shanterica Biehler, LCSW

## 2014-07-22 NOTE — Progress Notes (Signed)
Report from [place]. Pt appears very euphoric this afternoon and has been in the dayroom with the other pts. Pt denies SI and HI and contracts for safety.

## 2014-07-23 MED ORDER — PNEUMOCOCCAL VAC POLYVALENT 25 MCG/0.5ML IJ INJ
0.5000 mL | INJECTION | INTRAMUSCULAR | Status: AC
  Administered 2014-07-23: 0.5 mL via INTRAMUSCULAR

## 2014-07-23 MED ORDER — PNEUMOCOCCAL VAC POLYVALENT 25 MCG/0.5ML IJ INJ: 0.5000 mL | INJECTION | INTRAMUSCULAR | Status: DC

## 2014-07-23 NOTE — Progress Notes (Signed)
D: Pt has anxious affect and mood.  She reports "I'm just trying to get ready to go home hopefully Monday."  Pt attended evening group and interacts appropriately with staff and peers, laughing with peers at times.  She denies SI/HI, denies hallucinations.   A: Medications administered per order.  Safety maintained.  Supported and encouraged pt.  PRN medication administered for itching and anxiety, see flowsheet. R: Pt is compliant with medications.  She verbally contracts for safety.  Will continue to monitor and assess for safety.

## 2014-07-23 NOTE — Progress Notes (Signed)
Patient ID: Ashley Branch, female   DOB: Sep 25, 1950, 63 y.o.   MRN: [id number] Patient ID: Ashley Branch, female   DOB: 10-25-1950, 63 y.o.   MRN: [id number] [facility] MD Progress Note  07/23/2014 4:31 PM Kalliope Storts  MRN:  [id number] Subjective:  Patient states,' I am OK .  I just got tired from looking after my husband.  He has cancer".  The people here are wonderful;.  They are so helpful.  I did not that such a place existed."  Objective:Patient seen and chart reviewed.  Patient continues to report feeling drained and burned out 2/2 taking care of her boyfriend who has cancer stage 3. Patient reports that she felt very depressed as well as anxious at the times of admission. However she reports feeling improvement in her mood sx ,but not quiet ready yet. She remains evasive but pleasant. Shift from topic and easily distracted. Pt reassured ,provided supportive therapy as to how she should be creative enough to find some help for herself while taking care of her boyfriend who had cancer. Pt has been compliant on her medications.  States that Zyprexa just made her feel louse but that Vistaril worked well.  She states that she doesn't feel she needs to stay here longer. Denies withdrawal sx. Pt is participating in group activities and is motivated to get help.   Vital signs reviewed -wnl  Diagnosis:   DSM5: Primary Psychiatric Diagnosis: Major depressive disorder ,recurrent ,severe without psychosis  Secondary Psychiatric Diagnosis: Cocaine use disorder,moderate Sedative hypnotic and anxiolytic use disorder,moderate  Non Psychiatric Diagnosis: See pmh  Total Time spent with patient: 30 minutes  ADL's:  Intact  Sleep: Fair  Appetite:  Fair   Psychiatric Specialty Exam: Physical Exam  Vitals reviewed.   Review of Systems  Constitutional: Negative.   HENT: Negative.   Eyes: Negative.   Respiratory: Negative.   Cardiovascular: Negative.   Gastrointestinal: Negative.    Genitourinary: Negative.   Musculoskeletal: Negative.   Skin: Negative.   Neurological: Negative.   Endo/Heme/Allergies: Negative.   Psychiatric/Behavioral: Positive for depression. Negative for suicidal ideas, hallucinations, memory loss and substance abuse. The patient is not nervous/anxious and does not have insomnia.     Blood pressure 90/63, pulse 104, temperature 99.1 F (37.3 C), temperature source Oral, resp. rate 18, height 5' 5.5" (1.664 m), weight 69.854 kg (154 lb).Body mass index is 25.23 kg/(m^2).  General Appearance: Casual  Eye Contact::  Good  Speech:  Clear and Coherent  Volume:  Normal  Mood:  Anxious, Depressed and improving  Affect:  Depressed and Labile  Thought Process:  Somewhat circumstantial  Orientation:  Full (Time, Place, and Person)  Thought Content:  WDL  Suicidal Thoughts:  No  Homicidal Thoughts:  No  Memory:  Immediate;   Fair Recent;   Fair Remote;   Fair  Judgement:  Impaired  Insight:  Shallow  Psychomotor Activity:  Restlessness  Concentration:  Fair  Recall:  [employer] of Knowledge:Fair  Language: Good  Akathisia:  No  Handed:  Right  AIMS (if indicated):     Assets:  Communication Skills Desire for Improvement  Sleep:  Number of Hours: 5.75   Musculoskeletal: Strength & Muscle Tone: within normal limits Gait & Station: normal Patient leans: N/A  Current Medications: Current Facility-Administered Medications  Medication Dose Route Frequency Provider Last Rate Last Dose  . alum & mag hydroxide-simeth (MAALOX/MYLANTA) 200-200-20 MG/5ML suspension 30 mL  30 mL Oral Q4H PRN Kerry Hough, PA-C  30 mL at 07/20/14 0053  . busPIRone (BUSPAR) tablet 15 mg  15 mg Oral TID Jomarie LongsSaramma Eappen, MD   15 mg at 07/23/14 1306  . diphenhydrAMINE (BENADRYL) capsule 25 mg  25 mg Oral QHS PRN Caprice KluverVinay P Saranga, MD   25 mg at 07/22/14 2234   Or  . diphenhydrAMINE (BENADRYL) injection 25 mg  25 mg Intramuscular QHS PRN Caprice KluverVinay P Saranga, MD      .  famotidine (PEPCID) tablet 20 mg  20 mg Oral BID Kerry HoughSpencer E Simon, PA-C   20 mg at 07/23/14 0801  . FLUoxetine (PROZAC) capsule 80 mg  80 mg Oral Daily Kerry HoughSpencer E Simon, PA-C   80 mg at 07/23/14 0801  . fluticasone (FLONASE) 50 MCG/ACT nasal spray 2 spray  2 spray Each Nare Daily Caprice KluverVinay P Saranga, MD   2 spray at 07/23/14 0801  . hydrOXYzine (ATARAX/VISTARIL) tablet 25 mg  25 mg Oral Q6H PRN Kerry HoughSpencer E Simon, PA-C   25 mg at 07/22/14 2234  . ibuprofen (ADVIL,MOTRIN) tablet 600 mg  600 mg Oral Q6H PRN Jomarie LongsSaramma Eappen, MD   600 mg at 07/23/14 0416  . lisinopril (PRINIVIL,ZESTRIL) tablet 5 mg  5 mg Oral Daily Kerry HoughSpencer E Simon, PA-C   5 mg at 07/23/14 0800  . loratadine (CLARITIN) tablet 10 mg  10 mg Oral Daily Kerry HoughSpencer E Simon, PA-C   10 mg at 07/23/14 0801  . magnesium hydroxide (MILK OF MAGNESIA) suspension 30 mL  30 mL Oral Daily PRN Kerry HoughSpencer E Simon, PA-C      . multivitamin with minerals tablet 1 tablet  1 tablet Oral Daily Jomarie LongsSaramma Eappen, MD   1 tablet at 07/23/14 0801  . OLANZapine zydis (ZYPREXA) disintegrating tablet 5 mg  5 mg Oral Q6H PRN Jomarie LongsSaramma Eappen, MD   5 mg at 07/23/14 0924   Or  . OLANZapine (ZYPREXA) injection 5 mg  5 mg Intramuscular Q6H PRN Jomarie LongsSaramma Eappen, MD      . promethazine (PHENERGAN) tablet 50 mg  50 mg Oral Q6H PRN Kerry HoughSpencer E Simon, PA-C      . sulfamethoxazole-trimethoprim (BACTRIM,SEPTRA) 400-80 MG per tablet 1 tablet  1 tablet Oral Q12H Kerry HoughSpencer E Simon, PA-C   1 tablet at 07/23/14 0800  . thiamine (VITAMIN B-1) tablet 100 mg  100 mg Oral Daily Jomarie LongsSaramma Eappen, MD   100 mg at 07/23/14 0801  . traMADol (ULTRAM) tablet 50 mg  50 mg Oral Q12H PRN Jomarie LongsSaramma Eappen, MD   50 mg at 07/21/14 1101  . traZODone (DESYREL) tablet 100 mg  100 mg Oral QHS Jomarie LongsSaramma Eappen, MD   100 mg at 07/22/14 2232    Lab Results:  Results for orders placed or performed during the hospital encounter of 07/18/14 (from the past 48 hour(s))  TSH     Status: None   Collection Time: 07/21/14  7:41 PM  Result  Value Ref Range   TSH 0.412 0.350 - 4.500 uIU/mL    [facility]    Physical Findings: AIMS: Facial and Oral Movements Muscles of Facial Expression: None, normal Lips and Perioral Area: None, normal Jaw: None, normal Tongue: None, normal,Extremity Movements Upper (arms, wrists, hands, fingers): None, normal Lower (legs, knees, ankles, toes): None, normal, Trunk Movements Neck, shoulders, hips: None, normal, Overall Severity Severity of abnormal movements (highest score from questions above): None, normal Incapacitation due to abnormal movements: None, normal Patient's awareness of abnormal movements (rate only patient's report): No Awareness, Dental Status Current problems with  teeth and/or dentures?: Yes (no teeth) Does patient usually wear dentures?: No  CIWA:  CIWA-Ar Total: 1 COWS:  COWS Total Score: 0  Treatment Plan Summary: Daily contact with patient to assess and evaluate symptoms and progress in treatment Medication management  Plan: Continue current dose of Prozac. Supportive therapy  Reviewed labs ,will order as needed. TSH ordered. CSW will start working on disposition.  Patient to participate in therapeutic milieu  Medical Decision Making Problem Points:  Established problem, stable/improving (1), Review of last therapy session (1) and Review of psycho-social stressors (1) Data Points:  Review or order medicine tests (1) Review of medication regiment & side effects (2) Review of new medications or change in dosage (2)  I certify that inpatient services furnished can reasonably be expected to improve the patient's condition.   Adonis BrookAGUSTIN, Annlouise Gerety MAY, AGNP-BC 07/23/2014, 4:31 PM

## 2014-07-23 NOTE — Progress Notes (Signed)
Adult Psychoeducational Group Note  Date:  07/23/2014 Time:  9:44 PM  Group Topic/Focus:  Wrap-Up Group:   The focus of this group is to help patients review their daily goal of treatment and discuss progress on daily workbooks.  Participation Level:  Active  Participation Quality:  Appropriate  Affect:  Appropriate  Cognitive:  Appropriate  Insight: Appropriate  Engagement in Group:  Engaged  Modes of Intervention:  Discussion  Additional Comments: The patient expressed that she had a good day today.The patient also said she is ready for discharge.   Octavio Mannshigpen, Valine Drozdowski Lee 07/23/2014, 9:44 PM

## 2014-07-23 NOTE — [facility] Group Notes (Signed)
[facility] Group Notes:  (Nursing/MHT/Case Management/Adjunct)  Date:  07/23/2014  Time:  9:45am  Type of Therapy:  Nurse Education - Healthy Support Systems  Participation Level:  Active  Participation Quality:  Appropriate  Affect:  Appropriate  Cognitive:  Appropriate  Insight:  Appropriate  Engagement in Group:  Engaged  Modes of Intervention:  Discussion, Education and Support  Summary of Progress/Problems: Patient participated in a review of Healthy Support Systems and identified who those people are for her.  Lawrence MarseillesFriedman, Analilia Geddis Eakes 07/23/2014, 2:07 PM

## 2014-07-23 NOTE — Progress Notes (Signed)
Patient reports feeling well and reports overall improvement. Concerned that she awoke at 0400 and was unable to return to sleep. Affect appropriate with congruent mood though patient did request prn for racing thoughts, anxiety around 0930. Support, encouragement given. Meds given per orders. Zydis 5mg  po prn given with positive results. She remains visible on the unit with appropriate behavior.  Has not completed self inventory and will encourage to do so. Requesting pneumonia vaccine which will be given. Denies SI/HI/AVH and remains safe. Lawrence MarseillesFriedman, Selina Tapper Eakes

## 2014-07-23 NOTE — [facility] Group Notes (Signed)
[facility] Group Notes:  (Nursing/MHT/Case Management/Adjunct)  Date:  07/23/2014  Time:  9:30am  Type of Therapy:  Nurse Education - Patient Self Inventory  Participation Level:  Active  Participation Quality:  Appropriate  Affect:  Appropriate  Cognitive:  Appropriate  Insight:  Appropriate  Engagement in Group:  Engaged  Modes of Intervention:  Discussion, Education and Support  Summary of Progress/Problems: Patient reports feeling better however feels med still need some adjusting as she awoke at 0400 and could not return to sleep. States some she has had some racing thoughts this morning but they were relieved by prn medication.   Lawrence MarseillesFriedman, Deron Poole Eakes 07/23/2014, 2:05 PM

## 2014-07-23 NOTE — Progress Notes (Signed)
D: Pt has appropriate affect and pleasant mood.  Pt reports "today has been great.  They're going to come in the morning and probably release me.  I know being here, it has done wonderful things for me."  Pt reports her goal today was "to just be calm, don't try to be anxious.  Take in whatever I can use here."  Pt denies SI/HI, denies hallucinations.  Pt participated in evening group and interacts appropriately with peers and staff.   A: Medications administered per order.  Met with pt 1:1 and provided support and encouragement.  PRN medication administered for itching and anxiety, see flowsheet.  Safety maintained.   R: Pt is compliant with medications.  Pt verbally contracts for safety.  Will continue to monitor and assess for safety.

## 2014-07-23 NOTE — Plan of Care (Signed)
Problem: Alteration in mood; excessive anxiety as evidenced by: Goal: STG-Pt will report an absence of self-harm thoughts/actions (Patient will report an absence of self-harm thoughts or actions)  Outcome: Progressing Patient denying SI and has not engaged in any self harm.   Problem: Ineffective individual coping Goal: STG-Increase in ability to manage activities of daily living Outcome: Progressing Patient is up, dressed with hygiene completed. Able to voice needs.

## 2014-07-23 NOTE — [facility] Group Notes (Signed)
[facility] LCSW Group Therapy 07/23/2014 1:15pm  Type of Therapy: Group Therapy- Feelings Around Discharge & Establishing a Supportive Framework  Participation Level: Active   Modes of Intervention: Clarification, Confrontation, Discussion, Education, Exploration, Limit-setting, Orientation, Problem-solving, Rapport Building, Dance movement psychotherapisteality Testing, Socialization and Support   Description of Group:   What is a supportive framework? What does it look like feel like and how do I discern it from and unhealthy non-supportive network? Learn how to cope when supports are not helpful and don't support you. Discuss what to do when your family/friends are not supportive.  Summary of Patient Progress Pt participated actively in group discussion, identifying characteristics of both positive and unhealthy supports. Pt's participation was at times vague and unrelated to prompt. She discussed her ability to be open about her mental health issues with those in her support group which had positive impacts.   Therapeutic Modalities:   Cognitive Behavioral Therapy Person-Centered Therapy Motivational Interviewing   Chad CordialLauren Carter, LCSWA 07/23/2014 2:32 PM

## 2014-07-23 NOTE — Plan of Care (Signed)
Problem: Alteration in mood Goal: LTG-Patient reports reduction in suicidal thoughts (Patient reports reduction in suicidal thoughts and is able to verbalize a safety plan for whenever patient is feeling suicidal)  Outcome: Progressing Pt denied SI this shift.       

## 2014-07-24 MED ORDER — BUSPIRONE HCL 15 MG PO TABS
15.0000 mg | ORAL_TABLET | ORAL | Status: DC
Start: 2014-07-24 — End: 2014-07-25
  Administered 2014-07-24 – 2014-07-25 (×3): 15 mg via ORAL
  Filled 2014-07-24 (×6): qty 1

## 2014-07-24 NOTE — Progress Notes (Signed)
The focus of this group is to help patients review their daily goal of treatment and discuss progress on daily workbooks. Pt did not attend the evening group. 

## 2014-07-24 NOTE — Progress Notes (Signed)
D: Pt denies SI/HI/AVH. Pt is pleasant and cooperative. Pt concerned about going home tomorrow, "I'm glad I came here"   A: Pt was offered support and encouragement. Pt was given scheduled medications. Pt was encourage to attend groups. Q 15 minute checks were done for safety.   R:Pt attends groups and interacts well with peers and staff. Pt is taking medication. Pt has no complaints at this time .Pt receptive to treatment and safety maintained on unit.

## 2014-07-24 NOTE — Progress Notes (Signed)
Continued emotional and spiritual support around discharge plans, care for self in light of s/o illness.

## 2014-07-24 NOTE — Plan of Care (Signed)
Problem: Ineffective individual coping Goal: LTG: Patient will report a decrease in negative feelings Outcome: Progressing Patient reports that her mood is "good" today and denies SI/HI and AVH. Goal: STG: Patient will remain free from self harm Outcome: Progressing Patient remains free from self harm. 15 minute checks completed per protocol for pt safety.

## 2014-07-24 NOTE — Plan of Care (Signed)
Problem: Alteration in mood Goal: STG-Patient reports thoughts of self-harm to staff Outcome: Progressing Pt denies thoughts of self-harm.  She agreed to notify staff if she thinks of hurting herself.

## 2014-07-24 NOTE — Progress Notes (Signed)
Patient ID: Ashley Branch Blowers, female   DOB: 04/19/1951, 63 y.o.   MRN: [id number] Patient ID: Ashley Branch Pridmore, female   DOB: 04/18/1951, 63 y.o.   MRN[facility] [id number] [facility]  07/24/2014 11:57 AM Rebekha Fredricka BonineConnor  MRN:  [id number] Subjective:  Patient states,' I am still not sleeping ,but I do not want to sleep through the night either since my boyfriend needs my help at night."  Objective:Patient seen and chart reviewed.  Patient reports improvement from her sx. Reports medications as working and she has been feeling very relaxed. However reports sleep issues. She reports waking up several times at night. When discussed to change her medication dose ,pt reports not wanting to do that since she wants to take care of her boyfriend who has cancer and may need to wake up several times at night once she gets discharged. Pt has been compliant on her medications.  Denies withdrawal sx. Pt is participating in group activities and is motivated to get help.     Diagnosis:   DSM5: Primary Psychiatric Diagnosis: Major depressive disorder ,recurrent ,severe without psychosis  Secondary Psychiatric Diagnosis: Cocaine use disorder,moderate Sedative hypnotic and anxiolytic (Valium) use disorder,moderate  Non Psychiatric Diagnosis: See pmh  Total Time spent with patient: 30 minutes  ADL's:  Intact  Sleep: Poor  Appetite:  Fair   Psychiatric Specialty Exam: Physical Exam  Vitals reviewed.   Review of Systems  Constitutional: Negative.   HENT: Negative.   Eyes: Negative.   Respiratory: Negative.   Cardiovascular: Negative.   Gastrointestinal: Negative.   Genitourinary: Negative.   Musculoskeletal: Negative.   Skin: Negative.   Neurological: Negative.   Endo/Heme/Allergies: Negative.   Psychiatric/Behavioral: Positive for depression. Negative for suicidal ideas, hallucinations, memory loss and substance abuse. The patient is not nervous/anxious and does not have insomnia.     Blood  pressure 123/70, pulse 81, temperature 98.4 F (36.9 C), temperature source Oral, resp. rate 18, height 5' 5.5" (1.664 m), weight 69.854 kg (154 lb).Body mass index is 25.23 kg/(m^2).  General Appearance: Casual  Eye Contact::  Good  Speech:  Clear and Coherent  Volume:  Normal  Mood:  Anxious, Depressed and improving  Affect:  Depressed and Labile  Thought Process: linear  Orientation:  Full (Time, Place, and Person)  Thought Content:  WDL  Suicidal Thoughts:  No  Homicidal Thoughts:  No  Memory:  Immediate;   Fair Recent;   Fair Remote;   Fair  Judgement:  Impaired  Insight:  Shallow  Psychomotor Activity:  Normal  Concentration:  Fair  Recall:  [employer]  Fund of Knowledge:Fair  Language: Good  Akathisia:  No  Handed:  Right  AIMS (if indicated):     Assets:  Communication Skills Desire for Improvement  Sleep:  Number of Hours: 2.25   Musculoskeletal: Strength & Muscle Tone: within normal limits Gait & Station: normal Patient leans: N/A  Current Medications: Current Facility-Administered Medications  Medication Dose Route Frequency Provider Last Rate Last Dose  . alum & mag hydroxide-simeth (MAALOX/MYLANTA) 200-200-20 MG/5ML suspension 30 mL  30 mL Oral Q4H PRN Kerry HoughSpencer E Simon, PA-C   30 mL at 07/20/14 0053  . busPIRone (BUSPAR) tablet 15 mg  15 mg Oral [id number] Mirelle Biskup, MD      . diphenhydrAMINE (BENADRYL) capsule 25 mg  25 mg Oral QHS PRN Caprice KluverVinay P Saranga, MD   25 mg at 07/23/14 2211   Or  . diphenhydrAMINE (BENADRYL) injection 25 mg  25 mg Intramuscular QHS  PRN Caprice KluverVinay P Saranga, MD      . famotidine (PEPCID) tablet 20 mg  20 mg Oral BID Kerry HoughSpencer E Simon, PA-C   20 mg at 07/24/14 0750  . FLUoxetine (PROZAC) capsule 80 mg  80 mg Oral Daily Kerry HoughSpencer E Simon, PA-C   80 mg at 07/24/14 0750  . fluticasone (FLONASE) 50 MCG/ACT nasal spray 2 spray  2 spray Each Nare Daily Caprice KluverVinay P Saranga, MD   2 spray at 07/24/14 0750  . hydrOXYzine (ATARAX/VISTARIL) tablet 25 mg  25 mg  Oral Q6H PRN Kerry HoughSpencer E Simon, PA-C   25 mg at 07/24/14 1024  . ibuprofen (ADVIL,MOTRIN) tablet 600 mg  600 mg Oral Q6H PRN Jomarie LongsSaramma Charlottie Peragine, MD   600 mg at 07/24/14 1120  . lisinopril (PRINIVIL,ZESTRIL) tablet 5 mg  5 mg Oral Daily Kerry HoughSpencer E Simon, PA-C   5 mg at 07/24/14 [id number]  . loratadine (CLARITIN) tablet 10 mg  10 mg Oral Daily Kerry HoughSpencer E Simon, PA-C   10 mg at 07/24/14 0750  . magnesium hydroxide (MILK OF MAGNESIA) suspension 30 mL  30 mL Oral Daily PRN Kerry HoughSpencer E Simon, PA-C      . multivitamin with minerals tablet 1 tablet  1 tablet Oral Daily Jomarie LongsSaramma Samya Siciliano, MD   1 tablet at 07/24/14 0750  . OLANZapine zydis (ZYPREXA) disintegrating tablet 5 mg  5 mg Oral Q6H PRN Jomarie LongsSaramma Khadar Monger, MD   5 mg at 07/23/14 0924   Or  . OLANZapine (ZYPREXA) injection 5 mg  5 mg Intramuscular Q6H PRN Jomarie LongsSaramma Ramiah Helfrich, MD      . promethazine (PHENERGAN) tablet 50 mg  50 mg Oral Q6H PRN Kerry HoughSpencer E Simon, PA-C      . sulfamethoxazole-trimethoprim (BACTRIM,SEPTRA) 400-80 MG per tablet 1 tablet  1 tablet Oral Q12H Kerry HoughSpencer E Simon, PA-C   1 tablet at 07/24/14 0750  . thiamine (VITAMIN B-1) tablet 100 mg  100 mg Oral Daily Jomarie LongsSaramma Kemper Hochman, MD   100 mg at 07/24/14 0750  . traMADol (ULTRAM) tablet 50 mg  50 mg Oral Q12H PRN Jomarie LongsSaramma Adelena Desantiago, MD   50 mg at 07/21/14 1101  . traZODone (DESYREL) tablet 100 mg  100 mg Oral QHS Jomarie LongsSaramma Gershom Brobeck, MD   100 mg at 07/23/14 2211    Lab Results:  No results found for this or any previous visit (from the past 48 hour(s)).  Physical Findings: AIMS: Facial and Oral Movements Muscles of Facial Expression: None, normal Lips and Perioral Area: None, normal Jaw: None, normal Tongue: None, normal,Extremity Movements Upper (arms, wrists, hands, fingers): None, normal Lower (legs, knees, ankles, toes): None, normal, Trunk Movements Neck, shoulders, hips: None, normal, Overall Severity Severity of abnormal movements (highest score from questions above): None, normal Incapacitation due to abnormal  movements: None, normal Patient's awareness of abnormal movements (rate only patient's report): No Awareness, Dental Status Current problems with teeth and/or dentures?: Yes (no teeth) Does patient usually wear dentures?: No  CIWA:  CIWA-Ar Total: 1 COWS:  COWS Total Score: 0  Treatment Plan Summary: Daily contact with patient to assess and evaluate symptoms and progress in treatment Medication management  Plan: Will continue Prozac 80 mg po daily. Patient wants to stay on the same. Will continue Buspar 15 mg po tid but will change the timing so that she receives the evening dose at hs. Will continue CIWA And Ativan prn . Will continue Trazodone 100 mg po qhs for sleep.She does not want to increase the dose.  Will continue to monitor vitals ,  medication compliance and treatment side effects while patient is here.  Will monitor for medical issues as well as call consult as needed. Patient with dental problems. Will manage pain as needed. Continue Bactrim as scheduled for UTI.Diflucan 150 mg x1 dose for vaginal candidiasis.  Reviewed labs ,will order as needed. TSH wnl . CSW will start working on disposition.  Patient to participate in therapeutic milieu     Medical Decision Making     Medical Decision Making Problem Points:  Established problem, stable/improving (1), Review of last therapy session (1) and Review of psycho-social stressors (1) Data Points:  Review or order medicine tests (1) Review of medication regiment & side effects (2) Review of new medications or change in dosage (2)  I certify that inpatient services furnished can reasonably be expected to improve the patient's condition.   Alger Kerstein MD 07/24/2014, 11:57 AM

## 2014-07-24 NOTE — [facility] Group Notes (Signed)
[facility] LCSW Group Therapy  07/24/2014 1:34 PM  Type of Therapy:  Group Therapy  Participation Level:  Active  Participation Quality:  Attentive  Affect:  Appropriate  Cognitive:  Alert  Insight:  Improving  Engagement in Therapy:  Engaged  Modes of Intervention:  Discussion, Education, Exploration, Limit-setting, Problem-solving, Rapport Building, Socialization and Support  Summary of Progress/Problems:  Today's Topic: Community. Group members were asked to identify what "community" means to them, what values a community shares, and examples of where they felt a sense of community. Group members were challenged to process how a community functions to support its members and identify how the positive and negative aspects of a community impact the individual. Cherissa was attentive and engaged throughout today's processing group. She had difficulty remaining on topic when speaking, but was redirectable by CSW. Lillyan shared her negative experiences regarding community "my church and people in my life that are not helpful." She was able to acknowledge the importance of empathy in understanding the perspective of others when this concept was presented by another group member.   Smart, Ahlani Wickes LCSWA 07/24/2014, 1:34 PM

## 2014-07-24 NOTE — Tx Team (Signed)
Interdisciplinary Treatment Plan Update (Adult)   Date: 07/24/2014   Time Reviewed: 9:30AM Progress in Treatment:  Attending groups: Yes  Participating in groups:  Yes  Taking medication as prescribed: Yes  Tolerating medication: Yes  Family/Significant othe contact made: Contact attempts made with pt's S/O. SPE completed with pt.   Patient understands diagnosis: Yes, AEB seeking treatment for "being stressed out and out of my mind."  Discussing patient identified problems/goals with staff: Yes  Medical problems stabilized or resolved: Yes  Denies suicidal/homicidal ideation: Yes during group/self report.  Patient has not harmed self or Others: Yes  New problem(s) identified:  Discharge Plan or Barriers: Pt plans to return home with her husband at d/c.  She plans to followup with PCP at clinic for med management and [facility] for therapy. appt made. Additional Comments: Ashley Branch is an 63 y.o. female. [facility] initiated by daughter b/o bizarre behaviors, combative, and attempting to choking self. Patient is currently resistant to questions during this assessment. Patient is assumed to fall asleep between questions but is slightly aroused by touch. CSW spoke with the patient's daughter Brantley StageDaisy Wilkins [phone] or [phone] to collect collateral information. She reports that the patient's husband has cancer and was in the hospital for a couple so the patient was at the beside the entire time. In the hospital the patient was exhibiting aggression towards the staff and being irrational. Daughter reports this is not normal behavior as she is more rational and conservative. When they returned home the patient called 911 reporting that she could see the husband's blood clot moving up his leg requesting help. The husband was not experiencing any medical complications at that time per the daughter. When the police arrived the patient  became aggressive and push one of the officer. The patient was arrested but returned home later that day. Daughter reports the patient attempted to choke self with hair until she began to gasp for air. Patient was running out of the home naked per daughter's report.   12/7: Pt pleasant and cooperative. Attending and participating in groups. Reports low depression/no anxiety and states that she is ready for d/c Tuesday. Appts scheduled. Pt reports no SI/HI/AVH and presents with organized thought process and mood stability.  Reason for Continuation of Hospitalization: Medication management Estimated length of stay: 1 day For review of initial/current patient goals, please see plan of care.  Attendees:  Patient:    Family:    Physician: Dr. Elna BreslowEappen, MD 07/24/2014   Nursing: Samule DryPatrice, Ronecia, Foy Guadalajarahrista RN 07/24/2014   Clinical Social Worker Arling Cerone Smart, LCSWA  07/24/2014   Other: Richelle Itood North, LCSW 07/24/2014   Other: Liliane Badeolora Sutton, Care Coordinator  07/24/2014   Other: Onnie BoerJennifer Clark, Nurse CM 07/24/2014   Other:    Scribe for Treatment Team:  Trula SladeHeather Smart LCSWA 07/24/2014 12:29 PM

## 2014-07-24 NOTE — Progress Notes (Signed)
D: Patient is alert and oriented. Pt's mood and affect is "good" and appropriate to circumstance with some worrying about "friend with cancer." Pt reports difficulty sleeping, stating she "woke up at 2:30am" because she can't sleep well d/t worrying. Pt reports her "thoughts are going 100 miles per minute." Pt reports her depression 5/10.  Pt complains of headache 5/10. Pt states her goal for the day is to "focus, open mind and listen more." Pt denies SI/HI and AVH at this time. A: Support provided to pt. Cold pack given for headache. PRN medications administered for anxiety and pain per providers orders (See MAR). Scheduled medications administered per providers orders (See MAR). 15 minute checks completed per protocol for pt safety. R: Pt cooperative and receptive to nursing interventions.

## 2014-07-24 NOTE — [facility] Group Notes (Signed)
[facility] Note   07/24/2014 10:58 AM  Participation Quality:  Appropriate   Mood/Affect:  Appropriate  Depression Rating:  0  Anxiety Rating:  0  Thoughts of Suicide:  No Will you contract for safety?   NA  Current AVH:  No  Plan for Discharge/Comments:  Pt reports that she is feeling good this morning and will be ready for d/c on Tuesday. She [facility] about [facility] and thanked staff and group members for their support. Pt reports good sleep.   Transportation Means: Spiro/sig other   Supports: adult children and boyfriend   Smart, OncologistHeather LCSWA

## 2014-07-25 DIAGNOSIS — F1420 Cocaine dependence, uncomplicated: Secondary | ICD-10-CM

## 2014-07-25 DIAGNOSIS — F1320 Sedative, hypnotic or anxiolytic dependence, uncomplicated: Secondary | ICD-10-CM | POA: Insufficient documentation

## 2014-07-25 MED ORDER — IBUPROFEN 600 MG PO TABS: 600.0000 mg | ORAL_TABLET | Freq: Four times a day (QID) | ORAL | Status: AC | PRN

## 2014-07-25 MED ORDER — FLUTICASONE PROPIONATE 50 MCG/ACT NA SUSP: 2.0000 | Freq: Every day | NASAL | Status: AC

## 2014-07-25 MED ORDER — HYDROXYZINE HCL 25 MG PO TABS: 25.0000 mg | ORAL_TABLET | Freq: Four times a day (QID) | ORAL | Status: AC | PRN

## 2014-07-25 MED ORDER — PROMETHAZINE HCL 50 MG PO TABS: 50.0000 mg | ORAL_TABLET | Freq: Four times a day (QID) | ORAL | Status: AC | PRN

## 2014-07-25 MED ORDER — BUSPIRONE HCL 15 MG PO TABS: 15.0000 mg | ORAL_TABLET | ORAL | Status: AC

## 2014-07-25 MED ORDER — FEXOFENADINE HCL 180 MG PO TABS: 180.0000 mg | ORAL_TABLET | Freq: Every day | ORAL | Status: AC

## 2014-07-25 MED ORDER — TRAMADOL HCL 50 MG PO TABS: 50.0000 mg | ORAL_TABLET | Freq: Four times a day (QID) | ORAL | Status: AC | PRN

## 2014-07-25 MED ORDER — FLUOXETINE HCL 40 MG PO CAPS: 80.0000 mg | ORAL_CAPSULE | Freq: Every day | ORAL | Status: AC

## 2014-07-25 MED ORDER — TRAZODONE HCL 100 MG PO TABS: 100.0000 mg | ORAL_TABLET | Freq: Every day | ORAL | Status: AC

## 2014-07-25 MED ORDER — SULFAMETHOXAZOLE-TRIMETHOPRIM 400-80 MG PO TABS: 1.0000 | ORAL_TABLET | Freq: Two times a day (BID) | ORAL | Status: AC

## 2014-07-25 MED ORDER — IBUPROFEN 600 MG PO TABS: 600.0000 mg | ORAL_TABLET | Freq: Four times a day (QID) | ORAL | Status: DC | PRN

## 2014-07-25 MED ORDER — RANITIDINE HCL 150 MG PO TABS: 150.0000 mg | ORAL_TABLET | Freq: Two times a day (BID) | ORAL | Status: AC

## 2014-07-25 MED ORDER — LISINOPRIL 5 MG PO TABS: 5.0000 mg | ORAL_TABLET | Freq: Every day | ORAL | Status: AC

## 2014-07-25 NOTE — Discharge Summary (Signed)
Physician Discharge Summary Note  Patient:  Ashley Branch is an 63 y.o., female MRN:  [id number] DOB:  07-11-1951 Patient phone:  [phone] (home)  Patient address:   [address] [place] [address],  Total Time spent with patient: Greater than 30 minutes  Date of Admission:  07/18/2014 Date of Discharge: 07/25/14  Reason for Admission: Mood stabilization treatment  Discharge Diagnoses: Principal Problem:   Cocaine use disorder, moderate, in controlled environment Active Problems:   MDD (major depressive disorder), recurrent severe, without psychosis   Moderate benzodiazepine use disorder   Psychiatric Specialty Exam: Physical Exam  Psychiatric: Her speech is normal and behavior is normal. Judgment and thought content normal. Her mood appears not anxious. Her affect is not angry, not blunt, not labile and not inappropriate. Cognition and memory are normal. She does not exhibit a depressed mood.    Review of Systems  Constitutional: Negative.   HENT: Negative.   Eyes: Negative.   Respiratory: Negative.   Cardiovascular: Negative.   Gastrointestinal: Negative.   Genitourinary: Negative.   Musculoskeletal: Negative.   Skin: Negative.   Neurological: Negative.   Endo/Heme/Allergies: Negative.   Psychiatric/Behavioral: Positive for depression (Stable) and substance abuse (Cocaine & Benzodiazepine dependence). Negative for suicidal ideas, hallucinations and memory loss. The patient has insomnia (Stable). The patient is not nervous/anxious.     Blood pressure 98/63, pulse 101, temperature 99.5 F (37.5 C), temperature source Oral, resp. rate 16, height 5' 5.5" (1.664 m), weight 69.854 kg (154 lb).Body mass index is 25.23 kg/(m^2).  See Md's SRA         Past Psychiatric History: (Per admission assessment). Diagnosis: MDD  Hospitalizations:[facility] ,that was 20 years ago,reports that it was for cocaine abuse  Outpatient Care: yes ,is on prozac   Substance  Abuse Care: yes at [facility]  Self-Mutilation: Denies  Suicidal Attempts: yes at 63 years old,attempted to OD on ant poison  Violent Behaviors: yes ,recently was aggressive towards a Emergency planning/management officer ,reports that she just pointed her finger at him since she was irritable as she was not getting sleep   Musculoskeletal: Strength & Muscle Tone: within normal limits Gait & Station: normal Patient leans: N/A  DSM5: Schizophrenia Disorders:  NA Obsessive-Compulsive Disorders:  NA Trauma-Stressor Disorders:  NA Substance/Addictive Disorders: Hx:  Benzodiazepine dependence, cocaine dependence Depressive Disorders:  MDD (major depressive disorder), recurrent severe, without psychosis  Axis Diagnosis:  AXIS I:  MDD (major depressive disorder), recurrent severe, without psychosis, Benzodiazepine dependence, Cocaine dependence AXIS II:  Deferred AXIS III:   Past Medical History  Diagnosis Date  . Arthritis   . Hypertension   . Depression    AXIS IV:  other psychosocial or environmental problems and mental illness, chronic, polysubstance dependence AXIS V:  63  Level of Care:  OP  Hospital Course:  Ashley Branch is a 74 y.o. AA female who lives with her friend (he is like a husband ) of 30 years, has a hx of depression on Prozac, hx of cocaine use disorde, she was was brought into the ED by [facility] under IVC initiated by daughter b/o bizarre behaviors, combative, and attempting to choking self.  After admission assessment/evaluation, it was determined based on the presenting symptoms that Ashley Branch will need medication management to re-stabilize her current bizarre behaviors and combative symptoms. She was then ordered, medicated & discharged on; Hydroxyzine 25 mg four times daily as needed for anxiety, Prozac 40 mg daily for depression, Buspar 15 mg tid and Trazodone 100 Q  bedtime for sleep. She also was enrolled and participated in the Group counseling sessions being offered and held on this unit.  Ashley Branch learned coping skills that should help her cope better and maintain mood stability after discharge. While a patient in this hospital, she presented with other pre-existing medical issues that required treatment and monitoring. She was resumed on all her pertinent home medications for those health issues. She received antibiotic therapy for urinary tract infections as well. Ashley Branch tolerated her treatment regimen without any significant adverse effects and or reactions reported.  Ashley Branch's symptoms responded well to her treatment regimen. Her mood is now stable. This is evidenced by her reports of improved mood and presentation of good affects. She was also motivated for recovery. She worked closely with the treatment team and case manager to develop a discharge plan with appropriate goals to maintain mood stability after discharge. Coping skills, problem solving as well as relaxation therapies were also part of her unit programming. On the day of discharge she is in much improved condition than upon admission. Her symptoms were reported as significantly decreased or resolved completely. She was motivated to continue taking medication with a goal of continued improvement in mental health.    Upon discharge, she adamantly denies any SIHI, AVH, delusional thoughts and or paranoia. She received a 14 days worth, supply samples of [facility]. [facility] in no apparent distress. Ashley Branch arranged her own transportation.  Consults:  psychiatry  Significant Diagnostic Studies:  labs:  with diff, CMP, UDS, toxicology tests, U/A, reports reviewed, stable  Discharge Vitals:   Blood pressure 98/63, pulse 101, temperature 99.5 F (37.5 C), temperature source Oral, resp. rate 16, height 5' 5.5" (1.664 m), weight 69.854 kg (154 lb). Body mass index is 25.23 kg/(m^2). Lab Results:   No results found for this or any previous visit (from the past 72  hour(s)).  Physical Findings: AIMS: Facial and Oral Movements Muscles of Facial Expression: None, normal Lips and Perioral Area: None, normal Jaw: None, normal Tongue: None, normal,Extremity Movements Upper (arms, wrists, hands, fingers): None, normal Lower (legs, knees, ankles, toes): None, normal, Trunk Movements Neck, shoulders, hips: None, normal, Overall Severity Severity of abnormal movements (highest score from questions above): None, normal Incapacitation due to abnormal movements: None, normal Patient's awareness of abnormal movements (rate only patient's report): No Awareness, Dental Status Current problems with teeth and/or dentures?: Yes (no teeth) Does patient usually wear dentures?: No  CIWA:  CIWA-Ar Total: 1 COWS:  COWS Total Score: 0  Psychiatric Specialty Exam: See Psychiatric Specialty Exam and Suicide Risk Assessment completed by Attending Physician prior to discharge.  Discharge destination:  Home  Is patient on multiple antipsychotic therapies at discharge:  No   Has Patient had three or more failed trials of antipsychotic monotherapy by history:  No  Recommended Plan for Multiple Antipsychotic Therapies: NA    Medication List    STOP taking these medications        ARTHRITIS PAIN PO     BIOTIN PO      TAKE these medications      Indication   busPIRone 15 MG tablet  Commonly known as:  BUSPAR  Take 1 tablet (15 mg total) by mouth 3 (three) times daily at 8am, 2pm and bedtime. For generalized anxiety   Indication:  Generalized Anxiety Disorder     fexofenadine 180 MG tablet  Commonly known as:  ALLEGRA  Take 1 tablet (180 mg total)  by mouth daily. For allergies   Indication:  Hayfever     FLUoxetine 40 MG capsule  Commonly known as:  PROZAC  Take 2 capsules (80 mg total) by mouth daily. For depression   Indication:  Major Depressive Disorder     fluticasone 50 MCG/ACT nasal spray  Commonly known as:  FLONASE  Place 2 sprays into both  nostrils daily. For allergies   Indication:  Perennial Rhinitis, Hayfever     hydrOXYzine 25 MG tablet  Commonly known as:  ATARAX/VISTARIL  Take 1 tablet (25 mg total) by mouth every 6 (six) hours as needed for anxiety.   Indication:  Anxiety     ibuprofen 600 MG tablet  Commonly known as:  ADVIL,MOTRIN  Take 1 tablet (600 mg total) by mouth every 6 (six) hours as needed for fever, headache or moderate pain. (May purchase from over the counter at yr local pharmacy)   Indication:  Fever, Pain     lisinopril 5 MG tablet  Commonly known as:  PRINIVIL,ZESTRIL  Take 1 tablet (5 mg total) by mouth daily. For high blood pressure   Indication:  High Blood Pressure     promethazine 50 MG tablet  Commonly known as:  PHENERGAN  Take 1 tablet (50 mg total) by mouth every 6 (six) hours as needed for nausea or vomiting (nausea).   Indication:  Nausea, vomiting     ranitidine 150 MG tablet  Commonly known as:  ZANTAC  Take 1 tablet (150 mg total) by mouth 2 (two) times daily. For acid reflux   Indication:  Gastroesophageal Reflux Disease     sulfamethoxazole-trimethoprim 400-80 MG per tablet  Commonly known as:  BACTRIM,SEPTRA  Take 1 tablet by mouth every 12 (twelve) hours. For infection   Indication:  Infection     traMADol 50 MG tablet  Commonly known as:  ULTRAM  Take 1 tablet (50 mg total) by mouth every 6 (six) hours as needed for moderate pain (pain).   Indication:  Moderate to Moderately Severe Pain     traZODone 100 MG tablet  Commonly known as:  DESYREL  Take 1 tablet (100 mg total) by mouth at bedtime. sleep   Indication:  Trouble Sleeping       Follow-up Information    [facility] with [facility] On 07/27/2014.   Why:  Appt for therapy at 11:00AM on this date with Jasmine December. Please call within 48 hours to cancel or reschedule appt if necessary.    Contact information:   [facility] [address] [place], [place] [address] Phone: [phone] Fax:  [phone]      Follow up with [facility] On 07/31/2014.   Why:  Appt for hospital follow-up/medication management on this date at 10:15AM.    Contact information:   [address], [place] [address] Phone: [phone] Fax: [phone]     Follow-up recommendations: Activity:  As tolerated Diet: As recommended by your primary care doctor. Keep all scheduled follow-up appointments as recommended.   Comments:  Take all your medications as prescribed by your mental healthcare provider. Report any adverse effects and or reactions from your medicines to your outpatient provider promptly. Patient is instructed and cautioned to not engage in alcohol and or illegal drug use while on prescription medicines. In the event of worsening symptoms, patient is instructed to call the crisis hotline, 911 and or go to the nearest ED for appropriate evaluation and treatment of symptoms. Follow-up  with your primary care provider for your other medical issues, concerns and or health care needs.   Total Discharge Time:  Greater than 30 minutes.  SignedSanjuana Kava: Nwoko, Agnes I, PMHNP-BC 07/25/2014, 10:33 AM

## 2014-07-25 NOTE — Progress Notes (Signed)
D: Patient is alert and oriented. Pt's mood and affect is pleasant and appropriate to circumstance. Pt denies depression, hopelessness, and anxiety. Pt denies SI/HI and AVH. Pt reports her goal for the day is "listening more than talking a lot and be positive." Pt's temperature was elevated this morning per night RN (See doc flowesheet). BP experiencing orthostatic hypotension this morning (See doc flowsheet). A: Will reassess vitals. Provider made aware of pt's increased temperature and orthostatic hypotension. Scheduled medications administered per providers orders (See MAR). 15 minute checks completed per protocol for pt safety. R: Pt cooperative and receptive to nursing interventions.

## 2014-07-25 NOTE — Plan of Care (Signed)
Problem: Alteration in mood Goal: LTG-Patient reports reduction in suicidal thoughts (Patient reports reduction in suicidal thoughts and is able to verbalize a safety plan for whenever patient is feeling suicidal)  Outcome: Completed/Met Date Met:  07/25/14 Pt denies SI Goal: STG-Patient is able to discuss feelings and issues (Patient is able to discuss feelings and issues leading to depression)  Outcome: Adequate for Discharge PT informed writer of her triggers and issues causing her to feel overwhelmed and depressed. Pt admits to caring for her BF was the biggest stressor.

## 2014-07-25 NOTE — Progress Notes (Signed)
[facility] Discharge Plan :  Will you be returning to the same living situation after discharge: Yes,  home with sig other At discharge, do you have transportation home?:Yes,  significant other picking her up after lunch today Do you have the ability to pay for your medications:Yes,  mental health  Release of information consent forms completed and submitted to medical records by CSW.  Patient to Follow up at: Follow-up Information    [facility] with [facility] On 07/27/2014.   Why:  Appt for therapy at 11:00AM on this date with Jasmine DecemberSharon. Please call within 48 hours to cancel or reschedule appt if necessary.    Contact information:   [facility] [address] [place], [place] [address] Phone: [phone] Fax: [phone]      [facility] [facility] On 07/[facility]/2014.   Why:  Appt for hospital follow-up/medication management on this date at 10:15AM.    Contact information:   [address] [place], [place] [address] Phone: [phone] Fax: [phone]      Patient denies SI/HI:   Yes,  during group/self report.     Safety Planning and Suicide Prevention discussed:  Yes,  Contact attempts made with pt's boyfriend. SPE completed with pt and pt provided with SPI pamphlet. She was encouraged to share information with support network, ask questions, and talk about any concerns relating to SPE.  Smart, Marks Scalera LCSWA  07/25/2014, 8:19 AM

## 2014-07-25 NOTE — BHH Suicide Risk Assessment (Signed)
   Demographic Factors:  Divorced or widowed and Low socioeconomic status  Total Time spent with patient: 45 minutes  Psychiatric Specialty Exam: Physical Exam  ROS  Blood pressure 98/63, pulse 101, temperature 99.5 F (37.5 C), temperature source Oral, resp. rate 16, height 5' 5.5" (1.664 m), weight 69.854 kg (154 lb).Body mass index is 25.23 kg/(m^2).  General Appearance: Casual  Eye Contact::  Good  Speech:  Clear and Coherent  Volume:  Normal  Mood:  Euthymic  Affect:  Congruent  Thought Process:  Coherent  Orientation:  Full (Time, Place, and Person)  Thought Content:  WDL  Suicidal Thoughts:  No  Homicidal Thoughts:  No  Memory:  Immediate;   Fair Recent;   Fair Remote;   Fair  Judgement:  Intact  Insight:  Fair  Psychomotor Activity:  Normal  Concentration:  Fair  Recall:  [employer]Fair  Fund of Knowledge:Fair  Language: Good  Akathisia:  No  Handed:  Right  AIMS (if indicated):     Assets:  Communication Skills Desire for Improvement Housing Intimacy Physical Health Social Support  Sleep:  Number of Hours: 5.5    Musculoskeletal: Strength & Muscle Tone: within normal limits Gait & Station: normal Patient leans: N/A   Mental Status Per Nursing Assessment::   On Admission:     Current Mental Status by Physician: Patient denies SI/HI/AH/VH  Loss Factors: Legal issues and Partner has terminal stage cancer.  Historical Factors: Impulsivity  Risk Reduction Factors:   Living with another person, especially a relative and Positive social support  Continued Clinical Symptoms:  Alcohol/Substance Abuse/Dependencies Previous Psychiatric Diagnoses and Treatments  Cognitive Features That Contribute To Risk:  Patient is alert ,oriented x3    Suicide Risk:  Minimal: No identifiable suicidal ideation.    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 Arthritis   . Hypertension   . Depression    Plan Of Care/Follow-up recommendations: Patient this AM complained of feeling febrile,Temp -99.5, pt denies chest pain ,shortness of breath as well as headache.  Discussed with pt to monitor her sx, and to follow up with her PMD if symptoms worsens. Pt agrees with plan. Patient to take Advil for now. Activity:  no restrictions Diet:  regular  Is patient on multiple antipsychotic therapies at discharge:  No   Has Patient had three or more failed trials of antipsychotic monotherapy by history:  No  Recommended Plan for Multiple Antipsychotic Therapies: NA    Sloan Galentine MD 07/25/2014, 9:28 AM

## 2014-07-25 NOTE — Progress Notes (Signed)
DISCHARGE: D: Patient alert and oriented upon discharge. Pt in stable condition and ambulatory with a steady gait. Pt denies SI/HI and AVH. A: AVS reviewed and given to pt. Medications/Prescriptions given. Resources reviewed. Belongings returned to pt. Pt given the opportunity to ask questions and express concerns. Pt encouraged to follow up with primary care provider concerning elevated temperature documented this morning and for additional BP/temperature concerns as needed. R: Pt D/C'd to boyfriend.

## 2014-07-25 NOTE — B[facility] Group Notes (Signed)
[facility] Group Notes:  (Nursing/MHT/Case Management/Adjunct)  Date:  07/25/2014  Time:  0930am  Type of Therapy:  Nurse Education  Participation Level:  Active  Participation Quality:  Appropriate and Attentive  Affect:  Appropriate  Cognitive:  Alert and Appropriate  Insight:  Appropriate and Good  Engagement in Group:  Engaged  Modes of Intervention:  Education and Support  Summary of Progress/Problems: Pt verbalized the importance of self care including a goal to "take a positive vacation soon."  Guthrie, GrenadaBrittany A 07/25/2014, 10:11 AM

## 2014-07-27 NOTE — Progress Notes (Signed)
Patient Discharge Instructions:  After Visit Summary (AVS):   Faxed to:  07/27/14 Discharge Summary Note:   Faxed to:  07/27/14 Psychiatric Admission Assessment Note:   Faxed to:  07/27/14 Suicide Risk Assessment - Discharge Assessment:   Faxed to:  07/27/14 Faxed/Sent to the Next Level Care provider:  07/27/14 [facility]ax[facility] to [facility] @ [phone] Faxed to [facility] @ [phone]  Jerelene ReddenSheena E Ovilla, 07/27/2014, 3:45 PM

## 2014-08-30 NOTE — Clinical Social Work Note (Signed)
Pt called CSW, requesting that CSW call "the court in [facility] [place] and let them know my diagnosis and when I was in the hospital." CSW informed pt that written consent would be required but that she could pick up a letter indicating her admission and discharge date and her mental health diagnosis. Pt asked that CSW only put mental health diagnosis and not any substance abuse diagnoses in letter. She also requested that the reason for admission be added to this letter. CSW will have letter ready for patient today and pt will pick up at reception. Pt also provided with Medical Records number to order her records.  The Sherwin-WilliamsHeather Smart, LCSWA 08/30/2014 11:05 AM

## 2015-05-23 IMAGING — CT CT CERVICAL SPINE W/O CM
2 series · 10 of 14 positions shown, 12 images · non-contrast
Comparison: None.

CLINICAL DATA: Generalized neck pain

EXAM:
CT CERVICAL SPINE WITHOUT CONTRAST
TECHNIQUE: Multidetector CT imaging of the cervical spine was performed without
intravenous contrast. Multiplanar CT image reconstructions were also
generated.

[Series 3: c-spine st · axial · 0.27mm/px · z∈[-249,-155]mm · 4 of 79 slices shown]
[im 16/79  bone]
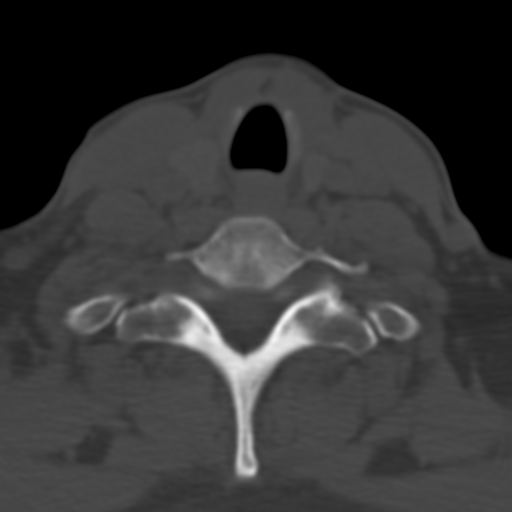
[im 32/79  bone]
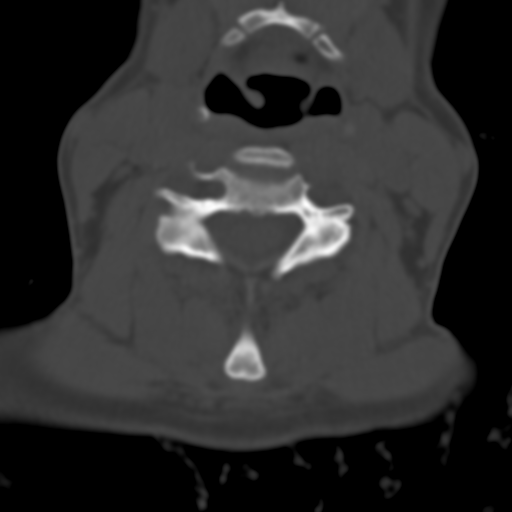
[im 47/79  bone]
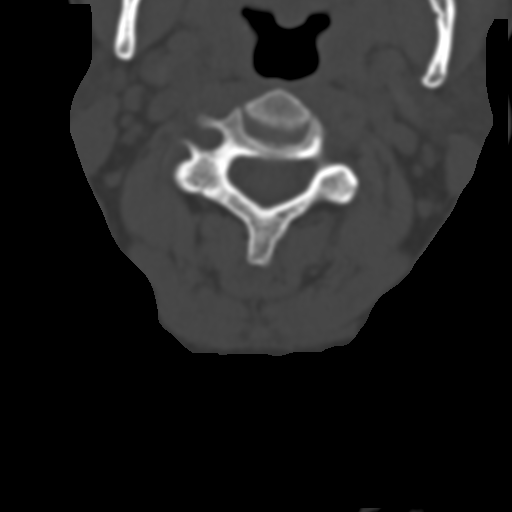
[im 63/79  bone]
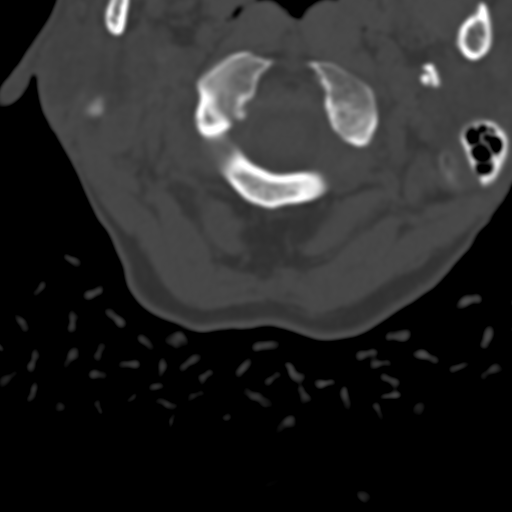

[Series 6: axial reformats · axial · 0.23mm/px · z∈[-279,-154]mm · 6 of 96 slices shown, 8 images]
[im 14/96  soft-tissue]
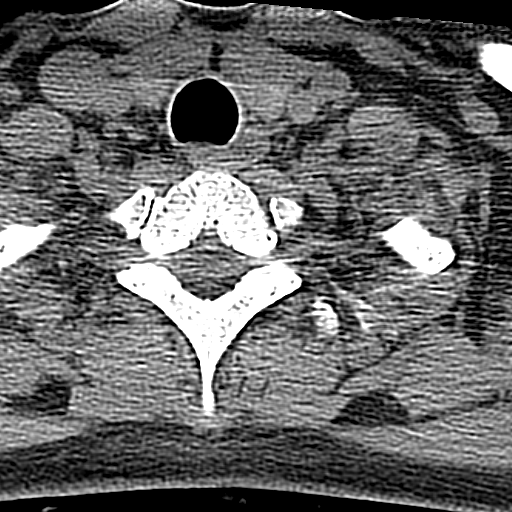
[im 14/96  bone]
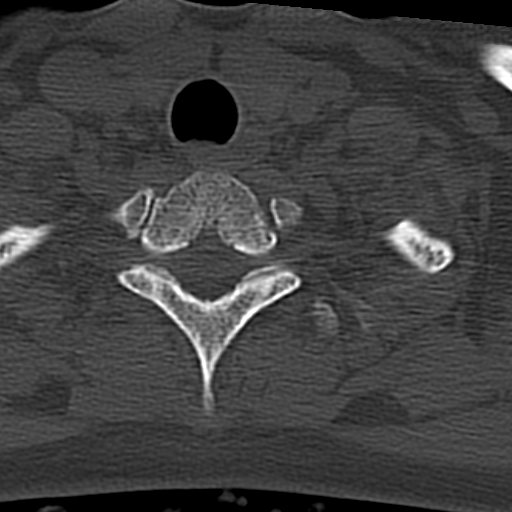
[im 28/96  bone]
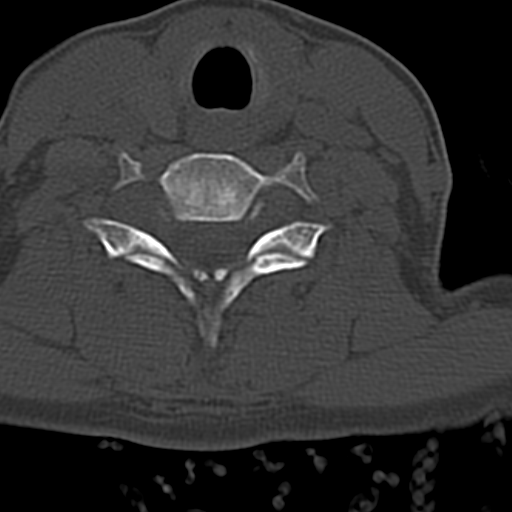
[im 41/96  bone]
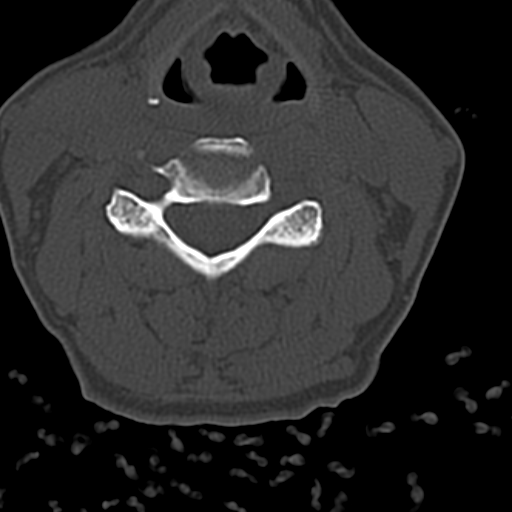
[im 55/96  bone]
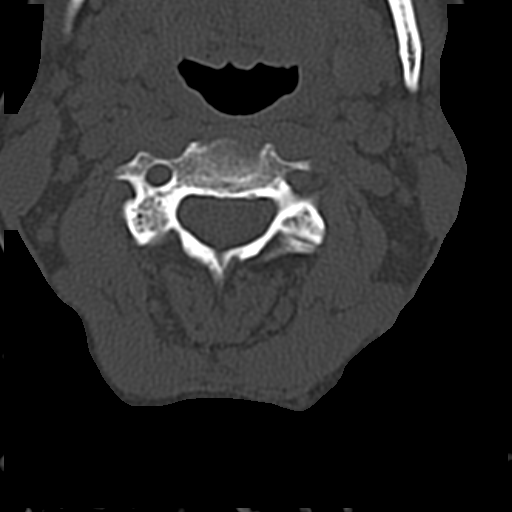
[im 68/96  soft-tissue]
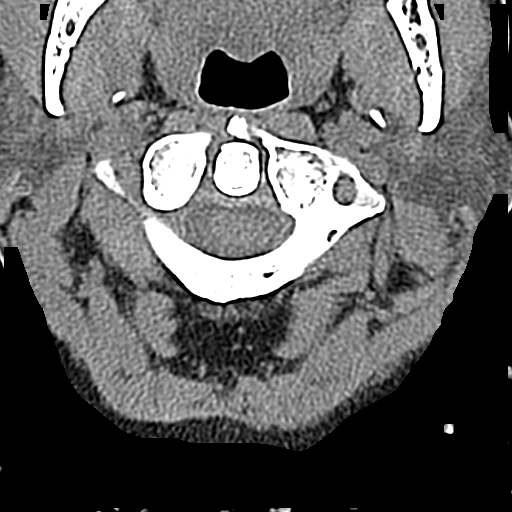
[im 68/96  bone]
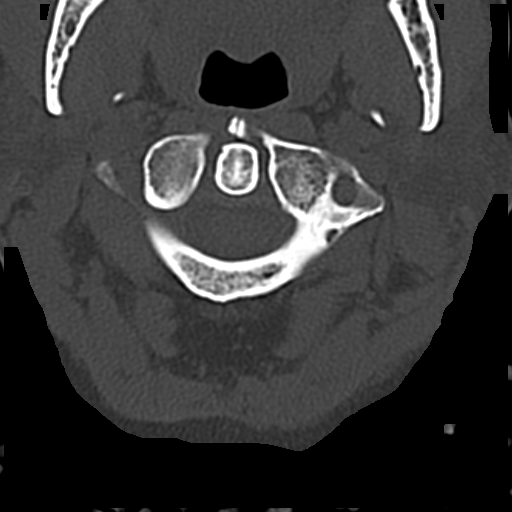
[im 82/96  bone]
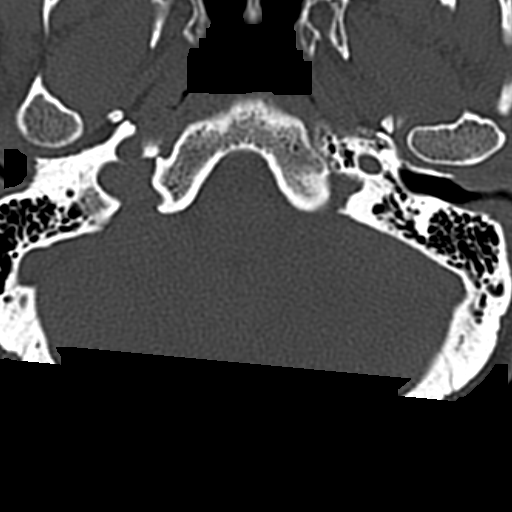

[10 of 14 positions shown; findings below may reference images not displayed]

FINDINGS: There is no fracture or spondylolisthesis. Prevertebral soft tissues
and predental space regions are normal. Disc spaces appear intact.

At C2-3, there is mild facet hypertrophy bilaterally. There is no
nerve root edema or effacement. No disc extrusion or stenosis.

At C3-4, there is mild facet hypertrophy bilaterally. There is
minimal diffuse disc bulging. There is no nerve root edema or
effacement. No disc extrusion or stenosis.

At C4-5, there is exit foraminal narrowing on the right due to bony
hypertrophy. There is mild impression on the exiting nerve root on
the right at C4-5 due to bony hypertrophy at the exit foraminal
level. No disc extrusion or stenosis. There is mild facet
hypertrophy bilaterally at this level.

At C5-6, there is mild facet hypertrophy bilaterally. No nerve root
edema or effacement. No disc extrusion or stenosis.

At C6-7, there is mild facet hypertrophy bilaterally. No nerve root
edema or effacement. No disc extrusion or stenosis.

At C7-T1, there is mild facet hypertrophy bilaterally. No nerve root
edema or effacement. No disc extrusion or stenosis.
IMPRESSION: Mild impression on the exiting nerve root on the right at C4-5 at
the exit foramen due to bony hypertrophy. No disc extrusion or
stenosis. Mild facet hypertrophy at multiple levels bilaterally. No
fracture or spondylolisthesis.
